# Patient Record
Sex: Female | Born: 1935 | Race: White | Hispanic: No | Smoking: Never smoker
Health system: Southern US, Community
[De-identification: ages and names within clinical notes are randomized; demographics above are authoritative.]

## PROBLEM LIST (undated history)

## (undated) DIAGNOSIS — Z8719 Personal history of other diseases of the digestive system: Secondary | ICD-10-CM

## (undated) DIAGNOSIS — I679 Cerebrovascular disease, unspecified: Secondary | ICD-10-CM

## (undated) DIAGNOSIS — S32009A Unspecified fracture of unspecified lumbar vertebra, initial encounter for closed fracture: Secondary | ICD-10-CM

## (undated) DIAGNOSIS — R270 Ataxia, unspecified: Secondary | ICD-10-CM

## (undated) DIAGNOSIS — M479 Spondylosis, unspecified: Secondary | ICD-10-CM

## (undated) DIAGNOSIS — K219 Gastro-esophageal reflux disease without esophagitis: Secondary | ICD-10-CM

## (undated) DIAGNOSIS — K5732 Diverticulitis of large intestine without perforation or abscess without bleeding: Secondary | ICD-10-CM

## (undated) DIAGNOSIS — K279 Peptic ulcer, site unspecified, unspecified as acute or chronic, without hemorrhage or perforation: Secondary | ICD-10-CM

## (undated) DIAGNOSIS — K635 Polyp of colon: Secondary | ICD-10-CM

## (undated) DIAGNOSIS — I6529 Occlusion and stenosis of unspecified carotid artery: Secondary | ICD-10-CM

## (undated) DIAGNOSIS — R5383 Other fatigue: Secondary | ICD-10-CM

## (undated) DIAGNOSIS — I10 Essential (primary) hypertension: Secondary | ICD-10-CM

## (undated) DIAGNOSIS — J45909 Unspecified asthma, uncomplicated: Secondary | ICD-10-CM

## (undated) DIAGNOSIS — E785 Hyperlipidemia, unspecified: Secondary | ICD-10-CM

## (undated) DIAGNOSIS — J310 Chronic rhinitis: Secondary | ICD-10-CM

## (undated) DIAGNOSIS — I2510 Atherosclerotic heart disease of native coronary artery without angina pectoris: Secondary | ICD-10-CM

## (undated) DIAGNOSIS — K449 Diaphragmatic hernia without obstruction or gangrene: Secondary | ICD-10-CM

## (undated) DIAGNOSIS — Z853 Personal history of malignant neoplasm of breast: Secondary | ICD-10-CM

## (undated) DIAGNOSIS — G319 Degenerative disease of nervous system, unspecified: Secondary | ICD-10-CM

## (undated) DIAGNOSIS — R002 Palpitations: Secondary | ICD-10-CM

## (undated) DIAGNOSIS — K5792 Diverticulitis of intestine, part unspecified, without perforation or abscess without bleeding: Secondary | ICD-10-CM

## (undated) DIAGNOSIS — F341 Dysthymic disorder: Secondary | ICD-10-CM

## (undated) DIAGNOSIS — R5381 Other malaise: Secondary | ICD-10-CM

## (undated) DIAGNOSIS — Z5189 Encounter for other specified aftercare: Secondary | ICD-10-CM

## (undated) DIAGNOSIS — K222 Esophageal obstruction: Secondary | ICD-10-CM

## (undated) HISTORY — DX: Palpitations: R00.2

## (undated) HISTORY — DX: Personal history of other diseases of the digestive system: Z87.19

## (undated) HISTORY — DX: Essential (primary) hypertension: I10

## (undated) HISTORY — DX: Diverticulitis of large intestine without perforation or abscess without bleeding: K57.32

## (undated) HISTORY — DX: Atherosclerotic heart disease of native coronary artery without angina pectoris: I25.10

## (undated) HISTORY — PX: OOPHORECTOMY: SHX86

## (undated) HISTORY — DX: Ataxia, unspecified: R27.0

## (undated) HISTORY — PX: VARICOSE VEIN SURGERY: SHX832

## (undated) HISTORY — DX: Diaphragmatic hernia without obstruction or gangrene: K44.9

## (undated) HISTORY — DX: Spondylosis, unspecified: M47.9

## (undated) HISTORY — DX: Esophageal obstruction: K22.2

## (undated) HISTORY — DX: Hyperlipidemia, unspecified: E78.5

## (undated) HISTORY — DX: Polyp of colon: K63.5

## (undated) HISTORY — DX: Degenerative disease of nervous system, unspecified: G31.9

## (undated) HISTORY — DX: Unspecified asthma, uncomplicated: J45.909

## (undated) HISTORY — DX: Dysthymic disorder: F34.1

## (undated) HISTORY — DX: Cerebrovascular disease, unspecified: I67.9

## (undated) HISTORY — DX: Gastro-esophageal reflux disease without esophagitis: K21.9

## (undated) HISTORY — DX: Other fatigue: R53.83

## (undated) HISTORY — DX: Other malaise: R53.81

## (undated) HISTORY — DX: Peptic ulcer, site unspecified, unspecified as acute or chronic, without hemorrhage or perforation: K27.9

## (undated) HISTORY — DX: Chronic rhinitis: J31.0

## (undated) HISTORY — DX: Unspecified fracture of unspecified lumbar vertebra, initial encounter for closed fracture: S32.009A

## (undated) HISTORY — DX: Occlusion and stenosis of unspecified carotid artery: I65.29

## (undated) HISTORY — DX: Encounter for other specified aftercare: Z51.89

## (undated) HISTORY — PX: CHOLECYSTECTOMY: SHX55

## (undated) HISTORY — DX: Personal history of malignant neoplasm of breast: Z85.3

## (undated) HISTORY — DX: Diverticulitis of intestine, part unspecified, without perforation or abscess without bleeding: K57.92

---

## 1977-08-22 HISTORY — PX: ABDOMINAL HYSTERECTOMY: SHX81

## 1997-08-22 HISTORY — PX: RECONSTRUCTION BREAST W/ TRAM FLAP: SUR1079

## 1997-08-22 HISTORY — PX: MASTECTOMY: SHX3

## 1997-12-25 ENCOUNTER — Ambulatory Visit (HOSPITAL_COMMUNITY): Admission: RE | Admit: 1997-12-25 | Discharge: 1997-12-25 | Payer: Self-pay | Admitting: Internal Medicine

## 1998-01-02 ENCOUNTER — Ambulatory Visit (HOSPITAL_COMMUNITY): Admission: RE | Admit: 1998-01-02 | Discharge: 1998-01-02 | Payer: Self-pay | Admitting: Surgery

## 1998-01-30 ENCOUNTER — Inpatient Hospital Stay (HOSPITAL_COMMUNITY): Admission: RE | Admit: 1998-01-30 | Discharge: 1998-02-04 | Payer: Self-pay | Admitting: Surgery

## 1998-12-28 ENCOUNTER — Ambulatory Visit (HOSPITAL_COMMUNITY): Admission: RE | Admit: 1998-12-28 | Discharge: 1998-12-28 | Payer: Self-pay | Admitting: Internal Medicine

## 1999-02-12 ENCOUNTER — Inpatient Hospital Stay (HOSPITAL_COMMUNITY): Admission: AD | Admit: 1999-02-12 | Discharge: 1999-02-16 | Payer: Self-pay | Admitting: Surgery

## 1999-02-12 ENCOUNTER — Encounter: Payer: Self-pay | Admitting: Surgery

## 1999-02-14 ENCOUNTER — Encounter: Payer: Self-pay | Admitting: General Surgery

## 1999-06-15 ENCOUNTER — Encounter: Admission: RE | Admit: 1999-06-15 | Discharge: 1999-06-15 | Payer: Self-pay | Admitting: Hematology and Oncology

## 1999-06-15 ENCOUNTER — Encounter: Payer: Self-pay | Admitting: Hematology and Oncology

## 2000-01-31 ENCOUNTER — Encounter: Payer: Self-pay | Admitting: Hematology and Oncology

## 2000-01-31 ENCOUNTER — Encounter: Admission: RE | Admit: 2000-01-31 | Discharge: 2000-01-31 | Payer: Self-pay | Admitting: Hematology and Oncology

## 2000-03-10 ENCOUNTER — Inpatient Hospital Stay (HOSPITAL_COMMUNITY): Admission: RE | Admit: 2000-03-10 | Discharge: 2000-03-12 | Payer: Self-pay | Admitting: Orthopedic Surgery

## 2000-08-22 HISTORY — PX: HERNIA REPAIR: SHX51

## 2000-08-22 HISTORY — PX: FINGER ARTHROPLASTY: SHX5017

## 2000-09-11 ENCOUNTER — Encounter: Payer: Self-pay | Admitting: Orthopedic Surgery

## 2000-09-15 ENCOUNTER — Inpatient Hospital Stay (HOSPITAL_COMMUNITY): Admission: RE | Admit: 2000-09-15 | Discharge: 2000-09-17 | Payer: Self-pay | Admitting: Orthopedic Surgery

## 2000-09-15 ENCOUNTER — Encounter (INDEPENDENT_AMBULATORY_CARE_PROVIDER_SITE_OTHER): Payer: Self-pay | Admitting: Specialist

## 2000-11-27 ENCOUNTER — Encounter: Payer: Self-pay | Admitting: Internal Medicine

## 2000-11-27 ENCOUNTER — Emergency Department (HOSPITAL_COMMUNITY): Admission: EM | Admit: 2000-11-27 | Discharge: 2000-11-27 | Payer: Self-pay | Admitting: Emergency Medicine

## 2001-01-24 ENCOUNTER — Encounter: Payer: Self-pay | Admitting: Hematology and Oncology

## 2001-01-24 ENCOUNTER — Encounter: Admission: RE | Admit: 2001-01-24 | Discharge: 2001-01-24 | Payer: Self-pay | Admitting: Hematology and Oncology

## 2001-01-31 ENCOUNTER — Encounter: Admission: RE | Admit: 2001-01-31 | Discharge: 2001-01-31 | Payer: Self-pay | Admitting: Hematology and Oncology

## 2001-01-31 ENCOUNTER — Encounter: Payer: Self-pay | Admitting: Hematology and Oncology

## 2001-03-30 ENCOUNTER — Observation Stay (HOSPITAL_COMMUNITY): Admission: RE | Admit: 2001-03-30 | Discharge: 2001-03-31 | Payer: Self-pay | Admitting: Surgery

## 2002-02-06 ENCOUNTER — Encounter: Admission: RE | Admit: 2002-02-06 | Discharge: 2002-02-06 | Payer: Self-pay | Admitting: Hematology and Oncology

## 2002-02-06 ENCOUNTER — Encounter: Payer: Self-pay | Admitting: Hematology and Oncology

## 2002-09-11 ENCOUNTER — Encounter: Payer: Self-pay | Admitting: Orthopedic Surgery

## 2002-09-17 ENCOUNTER — Ambulatory Visit (HOSPITAL_COMMUNITY): Admission: RE | Admit: 2002-09-17 | Discharge: 2002-09-17 | Payer: Self-pay | Admitting: Orthopedic Surgery

## 2002-09-26 ENCOUNTER — Encounter: Payer: Self-pay | Admitting: Orthopedic Surgery

## 2002-09-26 ENCOUNTER — Inpatient Hospital Stay (HOSPITAL_COMMUNITY): Admission: RE | Admit: 2002-09-26 | Discharge: 2002-09-28 | Payer: Self-pay | Admitting: Orthopedic Surgery

## 2003-02-10 ENCOUNTER — Encounter: Payer: Self-pay | Admitting: Oncology

## 2003-02-10 ENCOUNTER — Encounter: Admission: RE | Admit: 2003-02-10 | Discharge: 2003-02-10 | Payer: Self-pay | Admitting: Oncology

## 2003-08-23 DIAGNOSIS — I2510 Atherosclerotic heart disease of native coronary artery without angina pectoris: Secondary | ICD-10-CM

## 2003-08-23 HISTORY — DX: Atherosclerotic heart disease of native coronary artery without angina pectoris: I25.10

## 2004-02-09 ENCOUNTER — Ambulatory Visit (HOSPITAL_COMMUNITY): Admission: RE | Admit: 2004-02-09 | Discharge: 2004-02-09 | Payer: Self-pay | Admitting: Internal Medicine

## 2004-02-11 ENCOUNTER — Encounter: Admission: RE | Admit: 2004-02-11 | Discharge: 2004-02-11 | Payer: Self-pay | Admitting: Oncology

## 2004-04-12 ENCOUNTER — Inpatient Hospital Stay (HOSPITAL_COMMUNITY): Admission: AD | Admit: 2004-04-12 | Discharge: 2004-04-15 | Payer: Self-pay | Admitting: Internal Medicine

## 2004-04-27 ENCOUNTER — Ambulatory Visit (HOSPITAL_COMMUNITY): Admission: RE | Admit: 2004-04-27 | Discharge: 2004-04-27 | Payer: Self-pay | Admitting: Internal Medicine

## 2004-05-06 ENCOUNTER — Encounter (INDEPENDENT_AMBULATORY_CARE_PROVIDER_SITE_OTHER): Payer: Self-pay | Admitting: *Deleted

## 2004-05-06 ENCOUNTER — Ambulatory Visit: Admission: RE | Admit: 2004-05-06 | Discharge: 2004-05-06 | Payer: Self-pay | Admitting: Critical Care Medicine

## 2004-05-12 ENCOUNTER — Ambulatory Visit (HOSPITAL_COMMUNITY): Admission: RE | Admit: 2004-05-12 | Discharge: 2004-05-12 | Payer: Self-pay | Admitting: Critical Care Medicine

## 2004-06-23 ENCOUNTER — Ambulatory Visit: Payer: Self-pay | Admitting: *Deleted

## 2004-06-28 ENCOUNTER — Ambulatory Visit: Payer: Self-pay | Admitting: Internal Medicine

## 2004-06-30 ENCOUNTER — Encounter: Admission: RE | Admit: 2004-06-30 | Discharge: 2004-06-30 | Payer: Self-pay | Admitting: Internal Medicine

## 2004-07-01 ENCOUNTER — Ambulatory Visit: Payer: Self-pay | Admitting: Critical Care Medicine

## 2004-07-23 ENCOUNTER — Encounter: Admission: RE | Admit: 2004-07-23 | Discharge: 2004-07-23 | Payer: Self-pay | Admitting: Internal Medicine

## 2004-08-11 ENCOUNTER — Encounter: Admission: RE | Admit: 2004-08-11 | Discharge: 2004-08-11 | Payer: Self-pay | Admitting: Orthopedic Surgery

## 2004-08-31 ENCOUNTER — Encounter: Admission: RE | Admit: 2004-08-31 | Discharge: 2004-08-31 | Payer: Self-pay | Admitting: Internal Medicine

## 2004-10-18 ENCOUNTER — Ambulatory Visit: Payer: Self-pay | Admitting: Critical Care Medicine

## 2004-10-18 ENCOUNTER — Ambulatory Visit (HOSPITAL_COMMUNITY): Admission: RE | Admit: 2004-10-18 | Discharge: 2004-10-18 | Payer: Self-pay | Admitting: Internal Medicine

## 2004-10-18 ENCOUNTER — Ambulatory Visit: Payer: Self-pay | Admitting: Internal Medicine

## 2004-10-21 ENCOUNTER — Encounter: Admission: RE | Admit: 2004-10-21 | Discharge: 2004-10-21 | Payer: Self-pay | Admitting: Interventional Radiology

## 2004-10-26 ENCOUNTER — Ambulatory Visit (HOSPITAL_COMMUNITY): Admission: RE | Admit: 2004-10-26 | Discharge: 2004-10-26 | Payer: Self-pay | Admitting: Interventional Radiology

## 2004-10-26 ENCOUNTER — Encounter (INDEPENDENT_AMBULATORY_CARE_PROVIDER_SITE_OTHER): Payer: Self-pay | Admitting: Specialist

## 2004-12-07 ENCOUNTER — Ambulatory Visit (HOSPITAL_COMMUNITY): Admission: RE | Admit: 2004-12-07 | Discharge: 2004-12-07 | Payer: Self-pay | Admitting: Interventional Radiology

## 2004-12-10 ENCOUNTER — Encounter: Admission: RE | Admit: 2004-12-10 | Discharge: 2004-12-10 | Payer: Self-pay | Admitting: Interventional Radiology

## 2004-12-15 ENCOUNTER — Ambulatory Visit: Payer: Self-pay | Admitting: Internal Medicine

## 2004-12-27 ENCOUNTER — Ambulatory Visit (HOSPITAL_COMMUNITY): Admission: RE | Admit: 2004-12-27 | Discharge: 2004-12-27 | Payer: Self-pay | Admitting: Internal Medicine

## 2005-01-03 ENCOUNTER — Ambulatory Visit: Payer: Self-pay | Admitting: Oncology

## 2005-01-10 ENCOUNTER — Encounter: Payer: Self-pay | Admitting: Internal Medicine

## 2005-01-21 ENCOUNTER — Ambulatory Visit: Payer: Self-pay | Admitting: *Deleted

## 2005-01-31 ENCOUNTER — Ambulatory Visit: Payer: Self-pay

## 2005-02-02 ENCOUNTER — Ambulatory Visit: Payer: Self-pay | Admitting: *Deleted

## 2005-03-10 ENCOUNTER — Ambulatory Visit: Payer: Self-pay | Admitting: Internal Medicine

## 2005-03-16 ENCOUNTER — Encounter: Admission: RE | Admit: 2005-03-16 | Discharge: 2005-03-16 | Payer: Self-pay | Admitting: Oncology

## 2005-03-17 ENCOUNTER — Ambulatory Visit: Payer: Self-pay | Admitting: Oncology

## 2005-03-17 ENCOUNTER — Ambulatory Visit: Payer: Self-pay | Admitting: Internal Medicine

## 2005-04-08 ENCOUNTER — Ambulatory Visit: Payer: Self-pay | Admitting: Internal Medicine

## 2005-04-27 ENCOUNTER — Ambulatory Visit: Payer: Self-pay | Admitting: Cardiology

## 2005-05-05 ENCOUNTER — Ambulatory Visit: Payer: Self-pay

## 2005-06-01 ENCOUNTER — Ambulatory Visit: Payer: Self-pay | Admitting: Cardiology

## 2005-06-14 ENCOUNTER — Ambulatory Visit: Payer: Self-pay | Admitting: Cardiology

## 2005-06-14 ENCOUNTER — Ambulatory Visit: Payer: Self-pay

## 2005-06-27 ENCOUNTER — Ambulatory Visit: Payer: Self-pay | Admitting: Internal Medicine

## 2005-08-02 ENCOUNTER — Ambulatory Visit: Payer: Self-pay | Admitting: Cardiology

## 2005-08-24 ENCOUNTER — Ambulatory Visit: Payer: Self-pay | Admitting: Cardiology

## 2005-08-30 ENCOUNTER — Ambulatory Visit: Payer: Self-pay | Admitting: Internal Medicine

## 2005-10-17 ENCOUNTER — Ambulatory Visit: Payer: Self-pay | Admitting: Cardiology

## 2005-10-18 ENCOUNTER — Ambulatory Visit: Payer: Self-pay

## 2005-11-21 ENCOUNTER — Ambulatory Visit: Payer: Self-pay | Admitting: Internal Medicine

## 2005-11-21 ENCOUNTER — Ambulatory Visit: Payer: Self-pay | Admitting: Cardiology

## 2005-11-23 ENCOUNTER — Ambulatory Visit: Payer: Self-pay | Admitting: Internal Medicine

## 2005-11-29 ENCOUNTER — Encounter: Admission: RE | Admit: 2005-11-29 | Discharge: 2005-11-29 | Payer: Self-pay | Admitting: Internal Medicine

## 2005-11-29 ENCOUNTER — Ambulatory Visit: Payer: Self-pay | Admitting: Cardiology

## 2005-12-21 ENCOUNTER — Encounter: Payer: Self-pay | Admitting: Internal Medicine

## 2005-12-21 ENCOUNTER — Encounter: Payer: Self-pay | Admitting: Orthopedic Surgery

## 2006-01-10 ENCOUNTER — Ambulatory Visit: Payer: Self-pay | Admitting: Cardiology

## 2006-03-13 ENCOUNTER — Ambulatory Visit: Payer: Self-pay | Admitting: Cardiology

## 2006-03-28 ENCOUNTER — Encounter: Admission: RE | Admit: 2006-03-28 | Discharge: 2006-03-28 | Payer: Self-pay | Admitting: Internal Medicine

## 2006-06-08 ENCOUNTER — Ambulatory Visit: Payer: Self-pay | Admitting: Cardiology

## 2006-07-24 ENCOUNTER — Ambulatory Visit: Payer: Self-pay | Admitting: Internal Medicine

## 2006-07-26 ENCOUNTER — Ambulatory Visit: Payer: Self-pay | Admitting: Cardiology

## 2006-08-08 ENCOUNTER — Ambulatory Visit: Payer: Self-pay

## 2006-10-11 ENCOUNTER — Ambulatory Visit: Payer: Self-pay | Admitting: Internal Medicine

## 2006-12-07 ENCOUNTER — Ambulatory Visit: Payer: Self-pay | Admitting: Cardiology

## 2007-01-18 ENCOUNTER — Ambulatory Visit: Payer: Self-pay | Admitting: Internal Medicine

## 2007-01-18 LAB — CONVERTED CEMR LAB
Basophils Absolute: 0 10*3/uL (ref 0.0–0.1)
Basophils Relative: 0.1 % (ref 0.0–1.0)
Eosinophils Relative: 1.7 % (ref 0.0–5.0)
HCT: 39.5 % (ref 36.0–46.0)
Lymphocytes Relative: 18.8 % (ref 12.0–46.0)
MCHC: 34 g/dL (ref 30.0–36.0)
Neutrophils Relative %: 67.1 % (ref 43.0–77.0)
RBC: 4.31 M/uL (ref 3.87–5.11)
RDW: 13.2 % (ref 11.5–14.6)
Sed Rate: 40 mm/hr — ABNORMAL HIGH (ref 0–25)
Total CK: 301 units/L (ref 7–177)

## 2007-01-30 ENCOUNTER — Ambulatory Visit: Payer: Self-pay | Admitting: Internal Medicine

## 2007-02-21 ENCOUNTER — Ambulatory Visit: Payer: Self-pay | Admitting: Internal Medicine

## 2007-02-21 LAB — CONVERTED CEMR LAB
Cholesterol: 336 mg/dL (ref 0–200)
Direct LDL: 244.9 mg/dL
HDL: 55.4 mg/dL (ref >39.0)
Total CHOL/HDL Ratio: 6.1
Triglycerides: 146 mg/dL (ref 0–149)

## 2007-03-16 DIAGNOSIS — M479 Spondylosis, unspecified: Secondary | ICD-10-CM

## 2007-03-16 DIAGNOSIS — Z853 Personal history of malignant neoplasm of breast: Secondary | ICD-10-CM

## 2007-03-16 DIAGNOSIS — K279 Peptic ulcer, site unspecified, unspecified as acute or chronic, without hemorrhage or perforation: Secondary | ICD-10-CM | POA: Insufficient documentation

## 2007-03-16 DIAGNOSIS — K5732 Diverticulitis of large intestine without perforation or abscess without bleeding: Secondary | ICD-10-CM

## 2007-03-16 DIAGNOSIS — Z8719 Personal history of other diseases of the digestive system: Secondary | ICD-10-CM

## 2007-03-16 HISTORY — DX: Spondylosis, unspecified: M47.9

## 2007-03-16 HISTORY — DX: Diverticulitis of large intestine without perforation or abscess without bleeding: K57.32

## 2007-03-16 HISTORY — DX: Peptic ulcer, site unspecified, unspecified as acute or chronic, without hemorrhage or perforation: K27.9

## 2007-03-16 HISTORY — DX: Personal history of other diseases of the digestive system: Z87.19

## 2007-03-16 HISTORY — DX: Personal history of malignant neoplasm of breast: Z85.3

## 2007-03-23 ENCOUNTER — Ambulatory Visit: Payer: Self-pay | Admitting: Internal Medicine

## 2007-04-03 ENCOUNTER — Encounter: Admission: RE | Admit: 2007-04-03 | Discharge: 2007-04-03 | Payer: Self-pay | Admitting: Internal Medicine

## 2007-05-10 ENCOUNTER — Ambulatory Visit: Payer: Self-pay | Admitting: Internal Medicine

## 2007-05-10 LAB — CONVERTED CEMR LAB
Direct LDL: 176.5 mg/dL
HDL: 45.7 mg/dL (ref >39.0)
VLDL: 23 mg/dL (ref 0–40)

## 2007-05-16 ENCOUNTER — Ambulatory Visit: Payer: Self-pay | Admitting: Internal Medicine

## 2007-05-17 ENCOUNTER — Ambulatory Visit: Payer: Self-pay | Admitting: Internal Medicine

## 2007-05-17 ENCOUNTER — Encounter: Payer: Self-pay | Admitting: Internal Medicine

## 2007-05-18 DIAGNOSIS — E785 Hyperlipidemia, unspecified: Secondary | ICD-10-CM

## 2007-05-18 DIAGNOSIS — IMO0001 Reserved for inherently not codable concepts without codable children: Secondary | ICD-10-CM

## 2007-05-18 HISTORY — DX: Hyperlipidemia, unspecified: E78.5

## 2007-05-28 ENCOUNTER — Ambulatory Visit: Payer: Self-pay | Admitting: Internal Medicine

## 2007-05-28 LAB — CONVERTED CEMR LAB: Total CK: 166 units/L (ref 7–177)

## 2007-06-05 ENCOUNTER — Ambulatory Visit: Payer: Self-pay | Admitting: Cardiology

## 2007-06-13 ENCOUNTER — Encounter: Admission: RE | Admit: 2007-06-13 | Discharge: 2007-06-13 | Payer: Self-pay | Admitting: Rheumatology

## 2007-06-13 DIAGNOSIS — S32009A Unspecified fracture of unspecified lumbar vertebra, initial encounter for closed fracture: Secondary | ICD-10-CM

## 2007-06-13 HISTORY — DX: Unspecified fracture of unspecified lumbar vertebra, initial encounter for closed fracture: S32.009A

## 2007-06-14 ENCOUNTER — Ambulatory Visit: Payer: Self-pay

## 2007-06-14 ENCOUNTER — Telehealth: Payer: Self-pay | Admitting: Internal Medicine

## 2007-06-14 ENCOUNTER — Ambulatory Visit: Payer: Self-pay | Admitting: Cardiology

## 2007-06-14 ENCOUNTER — Inpatient Hospital Stay (HOSPITAL_COMMUNITY): Admission: AD | Admit: 2007-06-14 | Discharge: 2007-06-15 | Payer: Self-pay | Admitting: Cardiology

## 2007-06-21 ENCOUNTER — Inpatient Hospital Stay (HOSPITAL_BASED_OUTPATIENT_CLINIC_OR_DEPARTMENT_OTHER): Admission: RE | Admit: 2007-06-21 | Discharge: 2007-06-21 | Payer: Self-pay | Admitting: Cardiology

## 2007-06-21 ENCOUNTER — Observation Stay (HOSPITAL_COMMUNITY): Admission: AD | Admit: 2007-06-21 | Discharge: 2007-06-22 | Payer: Self-pay | Admitting: Cardiology

## 2007-06-21 ENCOUNTER — Ambulatory Visit: Payer: Self-pay | Admitting: Cardiology

## 2007-07-03 ENCOUNTER — Ambulatory Visit: Payer: Self-pay | Admitting: Cardiology

## 2007-07-03 ENCOUNTER — Encounter: Payer: Self-pay | Admitting: Internal Medicine

## 2007-08-01 ENCOUNTER — Ambulatory Visit: Payer: Self-pay | Admitting: Internal Medicine

## 2007-08-01 ENCOUNTER — Encounter (INDEPENDENT_AMBULATORY_CARE_PROVIDER_SITE_OTHER): Payer: Self-pay | Admitting: *Deleted

## 2007-08-01 DIAGNOSIS — R0989 Other specified symptoms and signs involving the circulatory and respiratory systems: Secondary | ICD-10-CM

## 2007-08-01 DIAGNOSIS — R0609 Other forms of dyspnea: Secondary | ICD-10-CM

## 2007-08-01 DIAGNOSIS — I10 Essential (primary) hypertension: Secondary | ICD-10-CM | POA: Insufficient documentation

## 2007-08-07 ENCOUNTER — Encounter: Payer: Self-pay | Admitting: Internal Medicine

## 2007-08-28 ENCOUNTER — Encounter: Payer: Self-pay | Admitting: Internal Medicine

## 2007-08-28 ENCOUNTER — Ambulatory Visit: Payer: Self-pay | Admitting: Internal Medicine

## 2007-09-06 ENCOUNTER — Ambulatory Visit: Payer: Self-pay | Admitting: Internal Medicine

## 2007-09-06 DIAGNOSIS — R5381 Other malaise: Secondary | ICD-10-CM

## 2007-09-06 DIAGNOSIS — R5383 Other fatigue: Secondary | ICD-10-CM

## 2007-09-06 HISTORY — DX: Other malaise: R53.81

## 2007-09-06 LAB — CONVERTED CEMR LAB
AST: 20 units/L (ref 0–37)
HDL: 47.5 mg/dL (ref >39.0)
Total Protein: 7.2 g/dL (ref 6.0–8.3)

## 2007-09-07 ENCOUNTER — Encounter: Payer: Self-pay | Admitting: Internal Medicine

## 2007-10-10 ENCOUNTER — Ambulatory Visit: Payer: Self-pay | Admitting: Critical Care Medicine

## 2007-10-10 DIAGNOSIS — K219 Gastro-esophageal reflux disease without esophagitis: Secondary | ICD-10-CM | POA: Insufficient documentation

## 2007-10-10 HISTORY — DX: Gastro-esophageal reflux disease without esophagitis: K21.9

## 2007-11-13 ENCOUNTER — Ambulatory Visit: Payer: Self-pay | Admitting: Critical Care Medicine

## 2007-11-19 ENCOUNTER — Ambulatory Visit: Payer: Self-pay | Admitting: Cardiology

## 2007-11-19 LAB — CONVERTED CEMR LAB
Alkaline Phosphatase: 53 units/L (ref 39–117)
Bilirubin, Direct: 0.1 mg/dL (ref 0.0–0.3)
Cholesterol: 214 mg/dL (ref 0–200)
Direct LDL: 154.7 mg/dL
Total Bilirubin: 0.7 mg/dL (ref 0.3–1.2)
Total CHOL/HDL Ratio: 4.6
Total CK: 182 units/L — ABNORMAL HIGH (ref 7–177)
Triglycerides: 88 mg/dL (ref 0–149)

## 2007-11-27 ENCOUNTER — Telehealth: Payer: Self-pay | Admitting: Internal Medicine

## 2007-11-28 ENCOUNTER — Telehealth: Payer: Self-pay | Admitting: Internal Medicine

## 2007-12-25 ENCOUNTER — Ambulatory Visit: Payer: Self-pay | Admitting: Critical Care Medicine

## 2007-12-25 ENCOUNTER — Ambulatory Visit: Payer: Self-pay | Admitting: Cardiology

## 2007-12-25 LAB — CONVERTED CEMR LAB
AST: 22 units/L (ref 0–37)
Alkaline Phosphatase: 48 units/L (ref 39–117)
HDL: 41.4 mg/dL (ref >39.0)
LDL Cholesterol: 124 mg/dL — ABNORMAL HIGH (ref 0–99)
Total Bilirubin: 0.8 mg/dL (ref 0.3–1.2)
Total CHOL/HDL Ratio: 4.6
Total Protein: 7.3 g/dL (ref 6.0–8.3)

## 2008-01-24 ENCOUNTER — Ambulatory Visit: Payer: Self-pay | Admitting: Internal Medicine

## 2008-01-24 DIAGNOSIS — N3000 Acute cystitis without hematuria: Secondary | ICD-10-CM

## 2008-01-24 LAB — CONVERTED CEMR LAB
Bacteria, UA: NEGATIVE
Crystals: NEGATIVE
Leukocytes, UA: NEGATIVE
RBC / HPF: NONE SEEN
Specific Gravity, Urine: >=1.03 (ref 1.000–1.03)
Total Protein, Urine: NEGATIVE mg/dL
Urine Glucose: NEGATIVE mg/dL
Urobilinogen, UA: 0.2 (ref 0.0–1.0)

## 2008-01-28 ENCOUNTER — Telehealth: Payer: Self-pay | Admitting: Internal Medicine

## 2008-01-30 ENCOUNTER — Ambulatory Visit: Payer: Self-pay | Admitting: Cardiology

## 2008-02-08 ENCOUNTER — Ambulatory Visit: Payer: Self-pay | Admitting: Internal Medicine

## 2008-02-08 LAB — CONVERTED CEMR LAB
Basophils Absolute: 0 10*3/uL (ref 0.0–0.1)
Basophils Relative: 0.6 % (ref 0.0–1.0)
Bilirubin Urine: NEGATIVE
Eosinophils Relative: 2.2 % (ref 0.0–5.0)
Hemoglobin, Urine: NEGATIVE
Lymphocytes Relative: 40 % (ref 12.0–46.0)
MCHC: 33.9 g/dL (ref 30.0–36.0)
MCV: 93.1 fL (ref 78.0–100.0)
Monocytes Relative: 11.2 % (ref 3.0–12.0)
Neutrophils Relative %: 46 % (ref 43.0–77.0)
Nitrite: NEGATIVE
Platelets: 285 10*3/uL (ref 150–400)
RBC: 4.05 M/uL (ref 3.87–5.11)
Urine Glucose: NEGATIVE mg/dL

## 2008-02-18 ENCOUNTER — Ambulatory Visit: Payer: Self-pay | Admitting: Internal Medicine

## 2008-04-08 ENCOUNTER — Encounter: Admission: RE | Admit: 2008-04-08 | Discharge: 2008-04-08 | Payer: Self-pay | Admitting: Internal Medicine

## 2008-04-16 ENCOUNTER — Ambulatory Visit: Payer: Self-pay | Admitting: Cardiovascular Disease

## 2008-04-16 LAB — CONVERTED CEMR LAB
ALT: 19 units/L (ref 0–35)
Bilirubin, Direct: 0.1 mg/dL (ref 0.0–0.3)
Cholesterol: 265 mg/dL (ref 0–200)
Triglycerides: 218 mg/dL (ref 0–149)
VLDL: 44 mg/dL — ABNORMAL HIGH (ref 0–40)

## 2008-05-08 ENCOUNTER — Ambulatory Visit: Payer: Self-pay | Admitting: Critical Care Medicine

## 2008-05-24 ENCOUNTER — Ambulatory Visit: Payer: Self-pay | Admitting: Family Medicine

## 2008-06-27 ENCOUNTER — Ambulatory Visit: Payer: Self-pay | Admitting: Cardiology

## 2008-08-12 ENCOUNTER — Telehealth: Payer: Self-pay | Admitting: Internal Medicine

## 2008-09-17 ENCOUNTER — Ambulatory Visit: Payer: Self-pay | Admitting: Critical Care Medicine

## 2008-09-17 DIAGNOSIS — J310 Chronic rhinitis: Secondary | ICD-10-CM

## 2008-09-17 HISTORY — DX: Chronic rhinitis: J31.0

## 2008-10-28 ENCOUNTER — Encounter: Payer: Self-pay | Admitting: Cardiology

## 2008-10-28 ENCOUNTER — Ambulatory Visit: Payer: Self-pay | Admitting: Cardiology

## 2008-10-28 DIAGNOSIS — R072 Precordial pain: Secondary | ICD-10-CM | POA: Insufficient documentation

## 2008-10-28 DIAGNOSIS — I6529 Occlusion and stenosis of unspecified carotid artery: Secondary | ICD-10-CM | POA: Insufficient documentation

## 2008-10-28 DIAGNOSIS — I2510 Atherosclerotic heart disease of native coronary artery without angina pectoris: Secondary | ICD-10-CM

## 2008-10-28 DIAGNOSIS — R002 Palpitations: Secondary | ICD-10-CM | POA: Insufficient documentation

## 2008-10-28 DIAGNOSIS — I10 Essential (primary) hypertension: Secondary | ICD-10-CM

## 2008-10-28 HISTORY — DX: Essential (primary) hypertension: I10

## 2008-10-28 HISTORY — DX: Atherosclerotic heart disease of native coronary artery without angina pectoris: I25.10

## 2008-10-28 HISTORY — DX: Occlusion and stenosis of unspecified carotid artery: I65.29

## 2008-10-30 ENCOUNTER — Ambulatory Visit: Payer: Self-pay | Admitting: Cardiology

## 2008-10-30 LAB — CONVERTED CEMR LAB
ALT: 20 units/L (ref 0–35)
Alkaline Phosphatase: 47 units/L (ref 39–117)
Bilirubin, Direct: 0.1 mg/dL (ref 0.0–0.3)
Cholesterol: 216 mg/dL (ref 0–200)
Direct LDL: 148.7 mg/dL
Glucose, Bld: 91 mg/dL (ref 70–99)
HDL: 47.8 mg/dL (ref >39.0)
Total Bilirubin: 0.9 mg/dL (ref 0.3–1.2)
Total Protein: 6.9 g/dL (ref 6.0–8.3)

## 2009-03-20 ENCOUNTER — Telehealth: Payer: Self-pay | Admitting: Internal Medicine

## 2009-03-28 ENCOUNTER — Ambulatory Visit: Payer: Self-pay | Admitting: Family Medicine

## 2009-03-28 ENCOUNTER — Telehealth: Payer: Self-pay | Admitting: Family Medicine

## 2009-03-30 ENCOUNTER — Ambulatory Visit: Payer: Self-pay | Admitting: Internal Medicine

## 2009-03-30 ENCOUNTER — Inpatient Hospital Stay (HOSPITAL_COMMUNITY): Admission: AD | Admit: 2009-03-30 | Discharge: 2009-04-02 | Payer: Self-pay | Admitting: Internal Medicine

## 2009-03-30 ENCOUNTER — Telehealth: Payer: Self-pay | Admitting: Internal Medicine

## 2009-03-31 ENCOUNTER — Encounter: Payer: Self-pay | Admitting: Cardiology

## 2009-03-31 ENCOUNTER — Ambulatory Visit: Payer: Self-pay | Admitting: Gastroenterology

## 2009-04-01 ENCOUNTER — Encounter: Payer: Self-pay | Admitting: Gastroenterology

## 2009-04-10 ENCOUNTER — Telehealth: Payer: Self-pay | Admitting: Internal Medicine

## 2009-04-28 ENCOUNTER — Telehealth: Payer: Self-pay | Admitting: Internal Medicine

## 2009-05-04 ENCOUNTER — Encounter: Admission: RE | Admit: 2009-05-04 | Discharge: 2009-05-04 | Payer: Self-pay | Admitting: Internal Medicine

## 2009-05-15 ENCOUNTER — Ambulatory Visit: Payer: Self-pay | Admitting: Cardiology

## 2009-05-18 ENCOUNTER — Encounter (INDEPENDENT_AMBULATORY_CARE_PROVIDER_SITE_OTHER): Payer: Self-pay | Admitting: *Deleted

## 2009-07-11 ENCOUNTER — Ambulatory Visit: Payer: Self-pay | Admitting: Family Medicine

## 2009-07-11 DIAGNOSIS — M719 Bursopathy, unspecified: Secondary | ICD-10-CM

## 2009-07-11 DIAGNOSIS — M67919 Unspecified disorder of synovium and tendon, unspecified shoulder: Secondary | ICD-10-CM | POA: Insufficient documentation

## 2009-08-25 ENCOUNTER — Ambulatory Visit: Payer: Self-pay | Admitting: Internal Medicine

## 2009-08-25 DIAGNOSIS — F341 Dysthymic disorder: Secondary | ICD-10-CM

## 2009-08-25 HISTORY — DX: Dysthymic disorder: F34.1

## 2009-09-08 ENCOUNTER — Ambulatory Visit: Payer: Self-pay | Admitting: Family Medicine

## 2009-09-08 ENCOUNTER — Telehealth (INDEPENDENT_AMBULATORY_CARE_PROVIDER_SITE_OTHER): Payer: Self-pay | Admitting: *Deleted

## 2009-09-08 ENCOUNTER — Encounter: Payer: Self-pay | Admitting: Internal Medicine

## 2009-09-10 ENCOUNTER — Ambulatory Visit: Payer: Self-pay | Admitting: Psychology

## 2009-09-17 ENCOUNTER — Ambulatory Visit: Payer: Self-pay | Admitting: Psychology

## 2009-09-21 ENCOUNTER — Encounter: Payer: Self-pay | Admitting: Internal Medicine

## 2009-09-28 ENCOUNTER — Ambulatory Visit: Payer: Self-pay | Admitting: Psychology

## 2009-10-12 ENCOUNTER — Ambulatory Visit: Payer: Self-pay | Admitting: Psychology

## 2009-11-05 ENCOUNTER — Ambulatory Visit: Payer: Self-pay | Admitting: Cardiology

## 2009-11-09 ENCOUNTER — Telehealth (INDEPENDENT_AMBULATORY_CARE_PROVIDER_SITE_OTHER): Payer: Self-pay | Admitting: *Deleted

## 2009-11-10 ENCOUNTER — Ambulatory Visit: Payer: Self-pay

## 2009-11-10 ENCOUNTER — Ambulatory Visit: Payer: Self-pay | Admitting: Cardiology

## 2009-11-10 ENCOUNTER — Encounter (HOSPITAL_COMMUNITY): Admission: RE | Admit: 2009-11-10 | Discharge: 2010-01-20 | Payer: Self-pay | Admitting: Cardiology

## 2009-11-11 LAB — CONVERTED CEMR LAB
ALT: 20 units/L (ref 0–35)
AST: 24 units/L (ref 0–37)
Albumin: 3.7 g/dL (ref 3.5–5.2)
Alkaline Phosphatase: 43 units/L (ref 39–117)
CO2: 29 meq/L (ref 19–32)
Calcium: 9.4 mg/dL (ref 8.4–10.5)
Cholesterol: 230 mg/dL — ABNORMAL HIGH (ref 0–200)
Potassium: 4.8 meq/L (ref 3.5–5.1)
Sodium: 142 meq/L (ref 135–145)
Total Bilirubin: 0.7 mg/dL (ref 0.3–1.2)
Total CHOL/HDL Ratio: 5
Total Protein: 7.3 g/dL (ref 6.0–8.3)
Triglycerides: 87 mg/dL (ref 0.0–149.0)

## 2009-11-16 ENCOUNTER — Ambulatory Visit: Payer: Self-pay | Admitting: Internal Medicine

## 2009-11-16 LAB — CONVERTED CEMR LAB
Basophils Absolute: 0 10*3/uL (ref 0.0–0.1)
Eosinophils Absolute: 0.1 10*3/uL (ref 0.0–0.7)
Eosinophils Relative: 1.7 % (ref 0.0–5.0)
HCT: 41.2 % (ref 36.0–46.0)
Lymphocytes Relative: 44.4 % (ref 12.0–46.0)
Lymphs Abs: 2.1 10*3/uL (ref 0.7–4.0)
Monocytes Absolute: 0.6 10*3/uL (ref 0.1–1.0)
Monocytes Relative: 13.5 % — ABNORMAL HIGH (ref 3.0–12.0)
Platelets: 295 10*3/uL (ref 150.0–400.0)
RBC: 4.32 M/uL (ref 3.87–5.11)
Saturation Ratios: 36.1 % (ref 20.0–50.0)
TSH: 3.5 microintl units/mL (ref 0.35–5.50)

## 2009-12-16 ENCOUNTER — Encounter (INDEPENDENT_AMBULATORY_CARE_PROVIDER_SITE_OTHER): Payer: Self-pay | Admitting: *Deleted

## 2009-12-17 ENCOUNTER — Telehealth (INDEPENDENT_AMBULATORY_CARE_PROVIDER_SITE_OTHER): Payer: Self-pay | Admitting: *Deleted

## 2009-12-24 ENCOUNTER — Ambulatory Visit: Payer: Self-pay | Admitting: Cardiology

## 2010-01-28 ENCOUNTER — Telehealth: Payer: Self-pay | Admitting: Internal Medicine

## 2010-02-15 ENCOUNTER — Ambulatory Visit: Payer: Self-pay | Admitting: Cardiology

## 2010-02-15 LAB — CONVERTED CEMR LAB
Alkaline Phosphatase: 43 units/L (ref 39–117)
Bilirubin, Direct: 0.1 mg/dL (ref 0.0–0.3)
Direct LDL: 127 mg/dL
HDL: 60.9 mg/dL (ref >39.00)
Total Bilirubin: 0.6 mg/dL (ref 0.3–1.2)
Total CHOL/HDL Ratio: 4
Total Protein: 7 g/dL (ref 6.0–8.3)
VLDL: 27 mg/dL (ref 0.0–40.0)

## 2010-02-25 ENCOUNTER — Ambulatory Visit: Payer: Self-pay | Admitting: Internal Medicine

## 2010-06-25 ENCOUNTER — Ambulatory Visit: Payer: Self-pay | Admitting: Cardiology

## 2010-06-25 LAB — CONVERTED CEMR LAB
Albumin: 3.7 g/dL (ref 3.5–5.2)
Cholesterol: 234 mg/dL — ABNORMAL HIGH (ref 0–200)
Direct LDL: 150.6 mg/dL
HDL: 58.2 mg/dL (ref >39.00)
Total Bilirubin: 0.5 mg/dL (ref 0.3–1.2)
Total CHOL/HDL Ratio: 4
Triglycerides: 125 mg/dL (ref 0.0–149.0)
VLDL: 25 mg/dL (ref 0.0–40.0)

## 2010-06-28 ENCOUNTER — Ambulatory Visit: Payer: Self-pay | Admitting: Internal Medicine

## 2010-07-12 ENCOUNTER — Ambulatory Visit: Payer: Self-pay | Admitting: Internal Medicine

## 2010-07-12 ENCOUNTER — Telehealth: Payer: Self-pay | Admitting: Internal Medicine

## 2010-07-13 ENCOUNTER — Encounter: Admission: RE | Admit: 2010-07-13 | Discharge: 2010-07-13 | Payer: Self-pay | Admitting: Internal Medicine

## 2010-07-13 LAB — HM MAMMOGRAPHY: HM Mammogram: NEGATIVE

## 2010-07-20 ENCOUNTER — Ambulatory Visit: Payer: Self-pay | Admitting: Cardiology

## 2010-09-11 ENCOUNTER — Encounter: Payer: Self-pay | Admitting: Internal Medicine

## 2010-09-12 ENCOUNTER — Encounter: Payer: Self-pay | Admitting: Interventional Radiology

## 2010-09-18 ENCOUNTER — Ambulatory Visit
Admission: RE | Admit: 2010-09-18 | Discharge: 2010-09-18 | Payer: Self-pay | Source: Home / Self Care | Attending: Family Medicine | Admitting: Family Medicine

## 2010-09-19 ENCOUNTER — Inpatient Hospital Stay (HOSPITAL_COMMUNITY)
Admission: EM | Admit: 2010-09-19 | Discharge: 2010-09-22 | DRG: 392 | Disposition: A | Discharge status: Home / Self Care | Payer: Medicare Other | Attending: Internal Medicine | Admitting: Internal Medicine

## 2010-09-19 DIAGNOSIS — K5732 Diverticulitis of large intestine without perforation or abscess without bleeding: Principal | ICD-10-CM | POA: Diagnosis present

## 2010-09-19 DIAGNOSIS — R0789 Other chest pain: Secondary | ICD-10-CM | POA: Diagnosis present

## 2010-09-19 DIAGNOSIS — Z9861 Coronary angioplasty status: Secondary | ICD-10-CM

## 2010-09-19 DIAGNOSIS — Z853 Personal history of malignant neoplasm of breast: Secondary | ICD-10-CM

## 2010-09-19 DIAGNOSIS — F3289 Other specified depressive episodes: Secondary | ICD-10-CM | POA: Diagnosis present

## 2010-09-19 DIAGNOSIS — Z79899 Other long term (current) drug therapy: Secondary | ICD-10-CM

## 2010-09-19 DIAGNOSIS — I10 Essential (primary) hypertension: Secondary | ICD-10-CM | POA: Diagnosis present

## 2010-09-19 DIAGNOSIS — I2510 Atherosclerotic heart disease of native coronary artery without angina pectoris: Secondary | ICD-10-CM | POA: Diagnosis present

## 2010-09-19 DIAGNOSIS — Z8711 Personal history of peptic ulcer disease: Secondary | ICD-10-CM

## 2010-09-19 DIAGNOSIS — Z7982 Long term (current) use of aspirin: Secondary | ICD-10-CM

## 2010-09-19 DIAGNOSIS — M47812 Spondylosis without myelopathy or radiculopathy, cervical region: Secondary | ICD-10-CM | POA: Diagnosis present

## 2010-09-19 DIAGNOSIS — F329 Major depressive disorder, single episode, unspecified: Secondary | ICD-10-CM | POA: Diagnosis present

## 2010-09-19 DIAGNOSIS — E785 Hyperlipidemia, unspecified: Secondary | ICD-10-CM | POA: Diagnosis present

## 2010-09-19 DIAGNOSIS — J309 Allergic rhinitis, unspecified: Secondary | ICD-10-CM | POA: Diagnosis present

## 2010-09-19 LAB — COMPREHENSIVE METABOLIC PANEL
ALT: 29 U/L (ref 0–35)
AST: 28 U/L (ref 0–37)
Calcium: 9.4 mg/dL (ref 8.4–10.5)
GFR calc Af Amer: 55 mL/min — ABNORMAL LOW (ref >60)
Sodium: 138 mEq/L (ref 135–145)
Total Protein: 8 g/dL (ref 6.0–8.3)

## 2010-09-19 LAB — CBC
HCT: 40.6 % (ref 36.0–46.0)
Hemoglobin: 13.8 g/dL (ref 12.0–15.0)
MCV: 90.8 fL (ref 78.0–100.0)
RBC: 4.47 MIL/uL (ref 3.87–5.11)
RDW: 14 % (ref 11.5–15.5)
WBC: 7.4 10*3/uL (ref 4.0–10.5)

## 2010-09-19 LAB — DIFFERENTIAL
Basophils Absolute: 0 10*3/uL (ref 0.0–0.1)
Eosinophils Relative: 1 % (ref 0–5)
Lymphocytes Relative: 20 % (ref 12–46)
Lymphs Abs: 1.5 10*3/uL (ref 0.7–4.0)
Neutro Abs: 4.8 10*3/uL (ref 1.7–7.7)
Neutrophils Relative %: 65 % (ref 43–77)

## 2010-09-19 LAB — URINE MICROSCOPIC-ADD ON

## 2010-09-19 LAB — URINALYSIS, ROUTINE W REFLEX MICROSCOPIC
Hgb urine dipstick: NEGATIVE
Specific Gravity, Urine: 1.023 (ref 1.005–1.030)
Urobilinogen, UA: 0.2 mg/dL (ref 0.0–1.0)
pH: 6 (ref 5.0–8.0)

## 2010-09-19 LAB — POCT CARDIAC MARKERS
CKMB, poc: <1 ng/mL — ABNORMAL LOW (ref 1.0–8.0)
Myoglobin, poc: 151 ng/mL (ref 12–200)
Troponin i, poc: <0.05 ng/mL (ref 0.00–0.09)
Troponin i, poc: <0.05 ng/mL (ref 0.00–0.09)

## 2010-09-19 LAB — CARDIAC PANEL(CRET KIN+CKTOT+MB+TROPI)
CK, MB: 1.9 ng/mL (ref 0.3–4.0)
Relative Index: 1.5 (ref 0.0–2.5)
Troponin I: 0.01 ng/mL (ref 0.00–0.06)

## 2010-09-20 LAB — COMPREHENSIVE METABOLIC PANEL
ALT: 25 U/L (ref 0–35)
BUN: 11 mg/dL (ref 6–23)
Calcium: 8 mg/dL — ABNORMAL LOW (ref 8.4–10.5)
Creatinine, Ser: 0.84 mg/dL (ref 0.4–1.2)
GFR calc non Af Amer: >60 mL/min (ref >60)
Glucose, Bld: 85 mg/dL (ref 70–99)
Sodium: 139 mEq/L (ref 135–145)
Total Protein: 5.9 g/dL — ABNORMAL LOW (ref 6.0–8.3)

## 2010-09-20 LAB — DIFFERENTIAL
Basophils Absolute: 0 10*3/uL (ref 0.0–0.1)
Eosinophils Relative: 1 % (ref 0–5)
Lymphs Abs: 1.7 10*3/uL (ref 0.7–4.0)
Monocytes Absolute: 0.9 10*3/uL (ref 0.1–1.0)
Neutro Abs: 3.9 10*3/uL (ref 1.7–7.7)

## 2010-09-20 LAB — CARDIAC PANEL(CRET KIN+CKTOT+MB+TROPI)
CK, MB: 1.9 ng/mL (ref 0.3–4.0)
Total CK: 125 U/L (ref 7–177)
Troponin I: 0.02 ng/mL (ref 0.00–0.06)

## 2010-09-20 LAB — MAGNESIUM: Magnesium: 2.1 mg/dL (ref 1.5–2.5)

## 2010-09-20 LAB — PHOSPHORUS: Phosphorus: 3.9 mg/dL (ref 2.3–4.6)

## 2010-09-20 LAB — CBC
HCT: 33.5 % — ABNORMAL LOW (ref 36.0–46.0)
MCH: 31 pg (ref 26.0–34.0)
MCHC: 34.3 g/dL (ref 30.0–36.0)
RDW: 13.9 % (ref 11.5–15.5)

## 2010-09-21 DIAGNOSIS — R079 Chest pain, unspecified: Secondary | ICD-10-CM

## 2010-09-21 DIAGNOSIS — K5732 Diverticulitis of large intestine without perforation or abscess without bleeding: Secondary | ICD-10-CM

## 2010-09-21 LAB — HEMOCCULT GUIAC POC 1CARD (OFFICE): Fecal Occult Bld: NEGATIVE

## 2010-09-22 DIAGNOSIS — R079 Chest pain, unspecified: Secondary | ICD-10-CM

## 2010-09-22 DIAGNOSIS — K5732 Diverticulitis of large intestine without perforation or abscess without bleeding: Secondary | ICD-10-CM

## 2010-09-22 LAB — CBC
HCT: 33.9 % — ABNORMAL LOW (ref 36.0–46.0)
Hemoglobin: 11.4 g/dL — ABNORMAL LOW (ref 12.0–15.0)
MCV: 90.4 fL (ref 78.0–100.0)
RBC: 3.75 MIL/uL — ABNORMAL LOW (ref 3.87–5.11)
WBC: 5.1 10*3/uL (ref 4.0–10.5)

## 2010-09-22 LAB — BASIC METABOLIC PANEL
BUN: 5 mg/dL — ABNORMAL LOW (ref 6–23)
Chloride: 108 mEq/L (ref 96–112)
GFR calc non Af Amer: 58 mL/min — ABNORMAL LOW (ref >60)
Glucose, Bld: 106 mg/dL — ABNORMAL HIGH (ref 70–99)
Potassium: 3.5 mEq/L (ref 3.5–5.1)
Sodium: 141 mEq/L (ref 135–145)

## 2010-09-23 ENCOUNTER — Telehealth: Payer: Self-pay | Admitting: Internal Medicine

## 2010-09-23 NOTE — Assessment & Plan Note (Signed)
Summary: lipid rov/eac   Visit Type:  Follow-up   [name] presents today for follow-up lipid consult.    Lipid goals are TC <200, TG <150, LDL <100 with optional <70, HDL >50.    In the past,patient has tried vytorin and lipitor.  Both caused significant muscle aches and cramps.  Symptoms were not particularly bothersome during the day, but were worse at night, especially upon lying down for bed.  Pt also experienced muscle soreness to the point it was difficult to get out of a chair.  She is currently taking Crestor 5mg  5 times a week.  She has started noticing occassional symptoms similar to those experienced before on this dose, but not to the extent that she cannot tolerate.  Patient is also taking Fish Oil 1000 mg three times a day.  She was not able to tolerate the Niacin.  She said it made her feel very clumsy and weak so she stopped taking it about 1 month ago.    Her current diet is very low in fat and cholesterol.  She has recently increased the amount of fish and fresh vegetables she has eaten because she has been at the beach.  She is also walking more often since her last visit.  She walks on the treadmill about 1/2hr at least 4 times a week.  She has also been walking at the beach recently.   Lipid Management Provider  Weston Brass, PharmD  Current Medications (verified): 1)  Multivitamins   Caps (Multiple Vitamin) .... Once Daily 2)  Calcium 600 1500 Mg  Tabs (Calcium Carbonate) .... Take 1 Tablet By Mouth Once A Day 3)  Lisinopril 20 Mg Tabs (Lisinopril) .Marland Kitchen.. 1 Tab Daily 4)  Aspirin Low Dose 81 Mg  Tabs (Aspirin) .... Once Daily 5)  Alendronate Sodium 70 Mg  Tabs (Alendronate Sodium) .... Take 1 Tablet By Mouth Once A Week 6)  Zyrtec Allergy 10 Mg  Tabs (Cetirizine Hcl) .... Once Daily As Needed 7)  Albuterol 90 Mcg/act  Aers (Albuterol) .... 2 Puffs Q 4 As Needed 8)  Crestor 5 Mg Tabs (Rosuvastatin Calcium) .... 5x Weekly 9)  Nitroglycerin 0.4 Mg  Subl (Nitroglycerin) .Marland Kitchen.. 1  Sublingual As Needed 10)  Qvar 40 Mcg/act  Aers (Beclomethasone Dipropionate) .... Inhale 2 Puffs Once Daily 11)  Nexium 40 Mg Cpdr (Esomeprazole Magnesium) .Marland Kitchen.. 1 By Mouth Once Daily 12)  Omega 3 1000 Mg  Caps (Omega-3 Fatty Acids) .... Take 3 Tablet By Mouth Once A Day 13)  Promethazine Hcl 12.5 Mg Tabs (Promethazine Hcl) .Marland Kitchen.. 1 By Mouth Q 6 As Needed Nausea. 14)  Zantac 150 Mg Tabs (Ranitidine Hcl) .Marland Kitchen.. 1 Tab By Mouth Once Daily  Allergies: 1)  ! Demerol 2)  ! Valium 3)  ! Augmentin 4)  ! Sulfamethoxazole-Trimethoprim (Sulfamethoxazole-Trimethoprim) 5)  ! Codeine   Vital Signs:  Patient profile:   77 year old female Height:      64 inches Weight:      143 pounds BMI:     24.63 BP sitting:   124 / 90 Cuff size:   regular  Impression & Recommendations:  Problem # 1:  HYPERLIPIDEMIA (ICD-272.4) Assessment Improved Pt's cholesterol panel has improved since taking Crestor 5mg  5 days of the week.  TC- 219 (goal <200), TG- 135 (goal <150), HDL- 61 (goal>45), and LDL 127 (goal <70)  She was not able to tolerate the Niacin.  She is relucant to increase the Crestor since she has some leg pain  now and does not want this to get worse.  She asked if it was okay to take a Mango seed capsule that Dr. Neil Crouch on TV had recommended for cholesterol.  We will let her try this between now and the next visit to see if there is any improvement in her LDL.  Will continue her current diet and exercise routine and f/u with repeat labs in 3 months.    The following medications were removed from the medication list:    Slo-niacin 500 Mg Cr-tabs (Niacin) .Marland Kitchen... Take 1 tablet each evening, 30 min after taking aspirin. Her updated medication list for this problem includes:    Crestor 5 Mg Tabs (Rosuvastatin calcium) .Marland Kitchen... 5x weekly  Patient Instructions: 1)  Continue Crestor 5mg  5 days a week and fish oil 3 capsules daily 2)  It will be okay to try Mango seed capsule twice daily 3)  Continue current diet and  exercise routine.  4)  Labs: 10/4 at [facility] office at 10am 5)  Lipid Clinic appt: 10/6 at 2pm

## 2010-09-23 NOTE — Assessment & Plan Note (Signed)
Summary: NAUSEA/ WEAK / LOW FEVER/SORE THROAT/NWS   Vital Signs:  Patient profile:   77 year old female Height:      64 inches Weight:      146 pounds BMI:     25.15 O2 Sat:      96 % on Room air Temp:     97.6 degrees F oral Pulse rate:   94 / minute BP sitting:   132 / 86  (left arm) Cuff size:   regular  Vitals Entered By: Bill Salinas CMA (August 25, 2009 10:31 AM)  O2 Flow:  Room air CC: pt here with complaint of nausea, weakness, dry cough and sore throat. Pt states the nausea has been going on for months but the sore throat just started about 1 week ago/ ab   Primary Care Provider:  Sanja Elizardo  CC:  pt here with complaint of nausea, weakness, and dry cough and sore throat. Pt states the nausea has been going on for months but the sore throat just started about 1 week ago/ ab.  History of Present Illness: Patient presents for URI symptoms with sore throat, malaise and feeling ill. No documented fever, no rigors, no SOB.  She also has significant stress with family issues over property and money that is very distressing. She feels that this may be a cause of underlying nausea and vomiting. She has a h/o GI bleed and diverticulitis.   Current Medications (verified): 1)  Multivitamins   Caps (Multiple Vitamin) .... Once Daily 2)  Calcium 600 1500 Mg  Tabs (Calcium Carbonate) .... Take 1 Tablet By Mouth Once A Day 3)  Lisinopril 20 Mg Tabs (Lisinopril) .Marland Kitchen.. 1 Tab Daily 4)  Aspirin Low Dose 81 Mg  Tabs (Aspirin) .... Once Daily 5)  Alendronate Sodium 70 Mg  Tabs (Alendronate Sodium) .... Take 1 Tablet By Mouth Once A Week 6)  Zyrtec Allergy 10 Mg  Tabs (Cetirizine Hcl) .... Once Daily As Needed 7)  Albuterol 90 Mcg/act  Aers (Albuterol) .... 2 Puffs Q 4 As Needed 8)  Crestor 5 Mg Tabs (Rosuvastatin Calcium) .... 3x Weekly 9)  Nitroglycerin 0.4 Mg  Subl (Nitroglycerin) .Marland Kitchen.. 1 Sublingual As Needed 10)  Qvar 40 Mcg/act  Aers (Beclomethasone Dipropionate) .... One  Puff Twice  Daily 11)  Prilosec 20 Mg Cpdr (Omeprazole) .Marland Kitchen.. 1 Tab By Mouth Once Daily 12)  Omega 3 1000 Mg  Caps (Omega-3 Fatty Acids) .... Take 2 Tablet By Mouth Once A Day  Allergies (verified): 1)  ! Demerol 2)  ! Valium 3)  ! Augmentin 4)  ! Sulfamethoxazole-Trimethoprim (Sulfamethoxazole-Trimethoprim)  Past History:  Past Medical History: Last updated: 05/15/2009 Breast cancer, hx of Peptic ulcer disease IBS DJD cervical spine RLL BOOP '05 hx of Diverticulitis Cystitis chronic rhinitis CAD s/p DES to Lcx 2005 Hyperlipidemia Hypertension Palpitations Depression  Past Surgical History: Last updated: 03/16/2007 right tramflam reconstruction-breast vein stripping ventral hernia repair '02 arthroplasty right thumb '02 Cholecystectomy Hysterectomy Mastectomy Oophorectomy  Family History: Last updated: 10/28/2008 Asthma Breast Cancer COPD:  father died of Lung Cancer Mother died at 31 of heart problems  Social History: widowed.  Has several children 3 dtrs, 2 sons and several grandchildren in  [place] and [address] She sees them often. Lives independently. Is very active.  Tobacco Use - No.   Review of Systems       The patient complains of weight loss, prolonged cough, abdominal pain, severe indigestion/heartburn, and depression.  The patient denies anorexia, fever, weight  gain, vision loss, decreased hearing, hoarseness, chest pain, syncope, dyspnea on exertion, peripheral edema, headaches, hemoptysis, melena, hematochezia, hematuria, muscle weakness, suspicious skin lesions, abnormal bleeding, and angioedema.    Physical Exam  General:  alert, well-developed, well-nourished, and well-hydrated.   Head:  normocephalic and atraumatic.  Mild sinus tenderness Eyes:  pupils equal, pupils round, corneas and lenses clear, and no injection.   Ears:  R ear normal and L ear normal.   Mouth:  throat with erythema but no exudate Neck:  supple and full ROM.   Lungs:  normal  respiratory effort and normal breath sounds.   Heart:  normal rate, regular rhythm, and no murmur.   Abdomen:  soft, normal bowel sounds, no distention, and no masses.  Very tender in the epigastrum and LUQ Msk:  normal ROM, no joint tenderness, and no joint swelling.   Pulses:  2+ radial Neurologic:  alert & oriented X3, cranial nerves II-XII intact, strength normal in all extremities, and gait normal.   Skin:  turgor normal and color normal.   Cervical Nodes:  no anterior cervical adenopathy and no posterior cervical adenopathy.   Psych:  Oriented X3, memory intact for recent and remote, normally interactive, and good eye contact.  Visibly upset as she relates her family issues to me - depressed by the situation and feeling helpless/hopeless. No suicidal ideation.    Impression & Recommendations:  Problem # 1:  URI (ICD-465.9) Patient with URI  Plan - Z-pak as directed           continue zyrtec  Her updated medication list for this problem includes:    Aspirin Low Dose 81 Mg Tabs (Aspirin) ..... Once daily    Zyrtec Allergy 10 Mg Tabs (Cetirizine hcl) ..... Once daily as needed    Promethazine Hcl 12.5 Mg Tabs (Promethazine hcl) .Marland Kitchen... 1 by mouth q 6 as needed nausea.  Problem # 2:  PEPTIC ULCER DISEASE (ICD-533.90) Patient with recurrent dyspepsia without sign of bleeding ulcer. She is very tender.  Plan - increase PPI to full therapeutic dose, i.e. omeprazole 40mg  or nexium 40 q AM           Zantac 150 by mouth qPM  Her updated medication list for this problem includes:    Nexium 40 Mg Cpdr (Esomeprazole magnesium) .Marland Kitchen... 1 by mouth once daily  Problem # 3:  ANXIETY DEPRESSION (ICD-300.4) marked anxiety with depression related to relationship problems with daughters centered on property and money.  Plan - return to Dr. Dellia Cloud for counseling.   Complete Medication List: 1)  Multivitamins Caps (Multiple vitamin) .... Once daily 2)  Calcium 600 1500 Mg Tabs (Calcium  carbonate) .... Take 1 tablet by mouth once a day 3)  Lisinopril 20 Mg Tabs (Lisinopril) .Marland Kitchen.. 1 tab daily 4)  Aspirin Low Dose 81 Mg Tabs (Aspirin) .... Once daily 5)  Alendronate Sodium 70 Mg Tabs (Alendronate sodium) .... Take 1 tablet by mouth once a week 6)  Zyrtec Allergy 10 Mg Tabs (Cetirizine hcl) .... Once daily as needed 7)  Albuterol 90 Mcg/act Aers (Albuterol) .... 2 puffs q 4 as needed 8)  Crestor 5 Mg Tabs (Rosuvastatin calcium) .... 3x weekly 9)  Nitroglycerin 0.4 Mg Subl (Nitroglycerin) .Marland Kitchen.. 1 sublingual as needed 10)  Qvar 40 Mcg/act Aers (Beclomethasone dipropionate) .... One  puff twice daily 11)  Nexium 40 Mg Cpdr (Esomeprazole magnesium) .Marland Kitchen.. 1 by mouth once daily 12)  Omega 3 1000 Mg Caps (Omega-3 fatty acids) .... Take 2  tablet by mouth once a day 13)  Promethazine Hcl 12.5 Mg Tabs (Promethazine hcl) .Marland Kitchen.. 1 by mouth q 6 as needed nausea. 14)  Zithromax Z-pak 250 Mg Tabs (Azithromycin) .... As directed (generic is fine)  Patient Instructions: 1)  Stomach - exam and history suggest significant gastritis. Plan - change to nexium 40mg  every AM and otc Zantac 150 mg after supper. Call if you don't see improvement over the next 2 weeks. 2)  Sore throat - throat is red. Lungs are clear. Plan - Z-pak as directed. Gargle of choice. 3)  Stress - very difficult situation. Please call for appointment with Dr. Dellia Cloud.  Prescriptions: ZITHROMAX Z-PAK 250 MG TABS (AZITHROMYCIN) as directed (generic is fine)  #1 x 0   Entered and Authorized by:   Jacques Navy MD   Signed by:   Jacques Navy MD on 08/25/2009   Method used:   Electronically to        [facility]* (retail)       [address]       [place], [place]  [address]       Ph: [phone]       Fax: [phone]   RxID:   [id number] NEXIUM 40 MG CPDR (ESOMEPRAZOLE MAGNESIUM) 1 by mouth once daily  #30 x 12   Entered and Authorized by:   Jacques Navy MD   Signed by:   Jacques Navy MD on  08/25/2009   Method used:   Electronically to        [facility]* (retail)       [address]       [place], [place]  [id number]       Ph: [phone]       Fax: [phone]   RxID:   [id number] PROMETHAZINE HCL 12.5 MG TABS (PROMETHAZINE HCL) 1 by mouth q 6 as needed nausea.  #15 x 3   Entered and Authorized by:   Jacques Navy MD   Signed by:   Jacques Navy MD on 08/25/2009   Method used:   Electronically to        [facility]* (retail)       [address]       [place], [place]  [address]       Ph: [phone]       Fax: [phone]   RxID:   [id number]

## 2010-09-23 NOTE — Progress Notes (Signed)
Summary: SHINGLES?   Phone Note Call from Patient Call back at [phone]   Summary of Call: Patient is requesting a call back regarding shingles. She is out of town at [employer].  Initial call taken by: Lamar Sprinkles, CMA,  January 28, 2010 12:52 PM  Follow-up for Phone Call        Pt says that she has had shingles in the past. C/o painful rash that started 2 days ago across ribcage. Patient is requesting rx.  Follow-up by: Lamar Sprinkles, CMA,  January 28, 2010 1:14 PM  Additional Follow-up for Phone Call Additional follow up Details #1::        Patient out of town. Best would be to have rash looked at. But...if no access to a doctor - valtrex (low risk profile) 1000mg  two times a day x 7 - #14, prednisone 20mg  once daily x 7-#7 Additional Follow-up by: Jacques Navy MD,  January 28, 2010 4:09 PM    Additional Follow-up for Phone Call Additional follow up Details #2::    Left mess w/daughter in law to check w/pharmacy. Called into [phone] Follow-up by: Lamar Sprinkles, CMA,  January 28, 2010 4:31 PM  New/Updated Medications: VALTREX 1 GM TABS (VALACYCLOVIR HCL) 1 two times a day x 7 days PREDNISONE 20 MG TABS (PREDNISONE) 1 once daily Prescriptions: PREDNISONE 20 MG TABS (PREDNISONE) 1 once daily  #7 x 0   Entered by:   Lamar Sprinkles, CMA   Authorized by:   Jacques Navy MD   Signed by:   Lamar Sprinkles, CMA on 01/28/2010   Method used:   Historical   RxID:   [id number] VALTREX 1 GM TABS (VALACYCLOVIR HCL) 1 two times a day x 7 days  #14 x 0   Entered by:   Lamar Sprinkles, CMA   Authorized by:   Jacques Navy MD   Signed by:   Lamar Sprinkles, CMA on 01/28/2010   Method used:   Historical   RxID:   [id number]

## 2010-09-23 NOTE — Assessment & Plan Note (Signed)
Summary: EARACHE--BREATHING DIFFICULT -STC   Vital Signs:  Patient profile:   77 year old female Height:      64 inches Weight:      141 pounds BMI:     24.29 Temp:     98.2 degrees F BP sitting:   128 / 70  (left arm) Cuff size:   regular  Vitals Entered By: Lamar Sprinkles, CMA (July 12, 2010 10:05 AM) CC: Dx w/middle ear infection while on cruise, still has ear discomfort and non productive cough.  Comments Pt does not have albuterol inhaler, unsure if she should have this med.  Given prochloperazine and xylometazoline on cruize.    Primary Care Provider:  Norins  CC:  Dx w/middle ear infection while on cruise and still has ear discomfort and non productive cough. .  History of Present Illness: Spent hte last month in [place] caring for asick relative with dementia who did suffer hip an dpelvis fractures who did die. She returned home and went on a cruise and suffered in the infrimary for several days on the cruise.she was told that she had an infected ear. she did get IV fluids and antiemetics.  She also had a bout of breathing trouble that was responsive to Qvar. She had been taking ginger products to combat sea-sickness.  Since returning home she has continued to have a non-productive cough. She also reports feeling cold, having no appetite. Nausea has resolved. She was taking procholorzaine (sic).   Allergies: 1)  ! Demerol 2)  ! Valium 3)  ! Augmentin 4)  ! Sulfamethoxazole-Trimethoprim (Sulfamethoxazole-Trimethoprim) 5)  ! Codeine  Past History:  Past Medical History: Last updated: 05/15/2009 Breast cancer, hx of Peptic ulcer disease IBS DJD cervical spine RLL BOOP '05 hx of Diverticulitis Cystitis chronic rhinitis CAD s/p DES to Lcx 2005 Hyperlipidemia Hypertension Palpitations Depression  Past Surgical History: Last updated: 03/16/2007 right tramflam reconstruction-breast vein stripping ventral hernia repair '02 arthroplasty right thumb  '02 Cholecystectomy Hysterectomy Mastectomy Oophorectomy  Family History: Last updated: 10/28/2008 Asthma Breast Cancer COPD:  father died of Lung Cancer Mother died at 74 of heart problems  Social History: Last updated: 08/25/2009 widowed.  Has several children 3 dtrs, 2 sons and several grandchildren in  [place] and [address] She sees them often. Lives independently. Is very active.  Tobacco Use - No.   Review of Systems       The patient complains of abdominal pain.  The patient denies anorexia, fever, weight loss, weight gain, decreased hearing, chest pain, syncope, dyspnea on exertion, prolonged cough, severe indigestion/heartburn, genital sores, muscle weakness, difficulty walking, depression, abnormal bleeding, and angioedema.    Physical Exam  General:  WNWD white female in no acute distress Head:  normocephalic, atraumatic, and no abnormalities observed.  Tendr to percussion over the left maxillary sinus.  Eyes:  vision grossly intact, pupils equal, pupils round, and corneas and lenses clear.   Ears:  right TM rosy and retracted. Left TM normal Mouth:  posterior pharynx is clear Neck:  supple.   Lungs:  normal respiratory effort, normal breath sounds, no crackles, and no wheezes.   Heart:  normal rate and regular rhythm.   Abdomen:  soft, non-tender, normal bowel sounds, no guarding, and no rigidity.   Msk:  normal ROM, no joint swelling, and no joint warmth.   Pulses:  2+ pulse radial pulse Neurologic:  alert & oriented X3, cranial nerves II-XII intact, and strength normal in all extremities.     Impression &  Recommendations:  Problem # 1:  OTITIS MEDIA, ACUTE, RIGHT (ICD-382.9) Mild residual changes right TM and sinus tenderness  Plan - amoxicillin 875 mg two times a day x 7  Her updated medication list for this problem includes:    Aspirin Low Dose 81 Mg Tabs (Aspirin) ..... Once daily    Amoxicillin 875 Mg Tabs (Amoxicillin) .Marland Kitchen... 1 by mouth two times  a day x 7 days for otitis media  Problem # 2:  COUGH (ICD-786.2) persistent non-productive cough.  Plan - tessalon perles 100mg  three times a day            prednisone 10 mg  2 once daily x 3, then 1 once daily x 3           for acute wheezing albuterol mdi as needed   Complete Medication List: 1)  Multivitamins Caps (Multiple vitamin) .... Once daily 2)  Calcium 600 1500 Mg Tabs (Calcium carbonate) .... Take 1 tablet by mouth once a day 3)  Lisinopril 20 Mg Tabs (Lisinopril) .Marland Kitchen.. 1 tab daily 4)  Aspirin Low Dose 81 Mg Tabs (Aspirin) .... Once daily 5)  Alendronate Sodium 70 Mg Tabs (Alendronate sodium) .... Take 1 tablet by mouth once a week 6)  Zyrtec Allergy 10 Mg Tabs (Cetirizine hcl) .... Once daily as needed 7)  Albuterol 90 Mcg/act Aers (Albuterol) .... 2 puffs q 4 as needed 8)  Crestor 5 Mg Tabs (Rosuvastatin calcium) .... 5x weekly 9)  Nitroglycerin 0.4 Mg Subl (Nitroglycerin) .Marland Kitchen.. 1 sublingual as needed 10)  Qvar 40 Mcg/act Aers (Beclomethasone dipropionate) .... Inhale 2 puffs once daily 11)  Nexium 40 Mg Cpdr (Esomeprazole magnesium) .Marland Kitchen.. 1 by mouth once daily 12)  Omega 3 1000 Mg Caps (Omega-3 fatty acids) .... Take 3 tablet by mouth once a day 13)  Zantac 150 Mg Tabs (Ranitidine hcl) .Marland Kitchen.. 1 tab by mouth once daily 14)  Tessalon Perles 100 Mg Caps (Benzonatate) .Marland Kitchen.. 1 by mouth three times a day x 10 days for persistent tickle cough 15)  Amoxicillin 875 Mg Tabs (Amoxicillin) .Marland Kitchen.. 1 by mouth two times a day x 7 days for otitis media 16)  Prednisone 10 Mg Tabs (Prednisone) .... 2 tabs once daily x 3, 1 once daily x 3  Patient Instructions: 1)  Ear infection - a little rosy. Plan 0- amoxicillin 875mg  two times a day for 7 days 2)  cough - more tracheal irritation type cough. Plan prednisone 20mg  once daily x 3, then 10 mg once daily x 3; tessalon perles 100mg  three times a day x 10 days. 3)  for wheezing and tightness - use the albuterol inhaler 2 puffs up to 4 times a day. If  you need more than this you need to be seen. For allergen exposure you may want to use the Qvar during the period of time you will be exposed.  Prescriptions: QVAR 40 MCG/ACT  AERS (BECLOMETHASONE DIPROPIONATE) Inhale 2 puffs once daily  #1 x 1   Entered and Authorized by:   Jacques Navy MD   Signed by:   Jacques Navy MD on 07/12/2010   Method used:   Electronically to        [facility]* (retail)       [address]       [place], [place]  [address]       Ph: [phone]       Fax: [phone]   RxID:   [id number] ALBUTEROL 90 MCG/ACT  AERS (ALBUTEROL) 2 puffs q 4 as needed  #1 x 1   Entered and Authorized by:   Jacques Navy MD   Signed by:   Jacques Navy MD on 07/12/2010   Method used:   Electronically to        [facility]* (retail)       [address]       [place], [place]  [id number]       Ph: [phone]       Fax: [phone]   RxID:   [id number] PREDNISONE 10 MG TABS (PREDNISONE) 2 tabs once daily x 3, 1 once daily x 3  #9 x 0   Entered and Authorized by:   Jacques Navy MD   Signed by:   Jacques Navy MD on 07/12/2010   Method used:   Electronically to        [facility]* (retail)       [address]       [place], [place]  [id number]       Ph: [phone]       Fax: [phone]   RxID:   [id number] AMOXICILLIN 875 MG TABS (AMOXICILLIN) 1 by mouth two times a day x 7 days for otitis media  #21 x 0   Entered and Authorized by:   Jacques Navy MD   Signed by:   Jacques Navy MD on 07/12/2010   Method used:   Electronically to        [facility]* (retail)       [address]       [place], [place]  [address]       Ph: [phone]       Fax: [phone]   RxID:   [id number] TESSALON PERLES 100 MG CAPS (BENZONATATE) 1 by mouth three times a day x 10 days for persistent tickle cough  #30 x 1   Entered and Authorized by:   Jacques Navy MD   Signed by:    Jacques Navy MD on 07/12/2010   Method used:   Electronically to        [facility]* (retail)       [address]       [place], [place]  [address]       Ph: [phone]       Fax: [phone]   RxID:   [id number]    Orders Added: 1)  Est. Patient Level III [52778]

## 2010-09-23 NOTE — Assessment & Plan Note (Signed)
Summary: Cardiology Nuclear Study  Nuclear Med Background Indications for Stress Test: Evaluation for Ischemia, Stent Patency   History: Asthma, COPD, Echo, Heart Catheterization, Myocardial Perfusion Study, Stents  History Comments: '05 STENTS CFX 10/08 Heart Cath N/O CAD NL LVF patent stent CFX 10/08 MPS (-) scar/ischemia EF 79% 08/10 ECHO NL LVF EF 60% Diastolic Dysfunction  Symptoms: Chest Pain, Chest Pressure, DOE    Nuclear Pre-Procedure Cardiac Risk Factors: Carotid Disease, Family History - CAD, Hypertension, Lipids, Smoker Caffeine/Decaff Intake: None NPO After: 9:00 PM Lungs: clear IV 0.9% NS with Angio Cath: 22g     IV Site: (L) AC IV Started by: Irean Hong RN Chest Size (in) 38     Cup Size B     Height (in): 64 Weight (lb): 142 BMI: 24.46  Nuclear Med Study 1 or 2 day study:  1 day     Stress Test Type:  Stress Reading MD:  Willa Rough, MD     Referring MD:  B.Crenshaw Resting Radionuclide:  Technetium 65m Tetrofosmin     Resting Radionuclide Dose:  11.0 mCi  Stress Radionuclide:  Technetium 45m Tetrofosmin     Stress Radionuclide Dose:  33.0 mCi   Stress Protocol Exercise Time (min):  4:30 min     Max HR:  133 bpm     Predicted Max HR:  142 bpm  Max Systolic BP: 167 mm Hg     Percent Max HR:  93.66 %     METS: 6.3 Rate Pressure Product:  08657    Stress Test Technologist:  Milana Na EMT-P     Nuclear Technologist:  Burna Mortimer Deal RT-N  Rest Procedure  Myocardial perfusion imaging was performed at rest 45 minutes following the intravenous administration of Myoview Technetium 48m Tetrofosmin.  Stress Procedure  The patient exercised for 4:30. The patient stopped due to fatigue, sob, and denied any chest pain.  There were no significant ST-T wave changes and occ pvcs.  Myoview was injected at peak exercise and myocardial perfusion imaging was performed after a brief delay.  QPS Raw Data Images:  Patient motion noted; appropriate software correction  applied. Stress Images:  There is normal uptake in all areas. Rest Images:  Normal homogeneous uptake in all areas of the myocardium. Subtraction (SDS):  No evidence of ischemia. Transient Ischemic Dilatation:  .97  (Normal <1.22)  Lung/Heart Ratio:  31  (Normal <0.45)  Quantitative Gated Spect Images QGS EDV:  43 ml QGS ESV:  8 ml QGS EF:  81 % QGS cine images:  Normal motion  Findings Normal nuclear study      Overall Impression  Exercise Capacity: Fair exercise capacity. BP Response: Normal blood pressure response. Clinical Symptoms: SOB ECG Impression: No significant ST segment change suggestive of ischemia. Overall Impression: Normal stress nuclear study.  Appended Document: Cardiology Nuclear Study ok  Appended Document: Cardiology Nuclear Study pt aware of results

## 2010-09-23 NOTE — Assessment & Plan Note (Signed)
Summary: ACUTE/RCD   Vital Signs:  Patient profile:   77 year old female Weight:      143 pounds Temp:     98.4 degrees F oral Pulse rate:   102 / minute Pulse rhythm:   regular BP sitting:   122 / 80  (left arm)  Vitals Entered By: Lamar Sprinkles, CMA (September 18, 2010 12:07 PM) CC: ? diverticuliits, low abd pain across abd but more on left side. Had cipro at home and took 2 doses/SD   History of Present Illness: Patient is a 77 yo Caucasian female in today with abdominal pain. Thursday she had sudden onset of diffuse abdominal discomfort. She reports her bowels have continued to move daily and are still formed but have turned yellowish. No diarrhea/nausea or vomitting but she is struggling with anorexia. She does believe she has had some low grade fevers and chills. She has also been noting significant urinary frquency but she denies any incontinence, hematuria or dysuria. She notes she had an episode of diverticulitis many years ago but has never had a recurrence. She denies any CP/palp/SOB/HA/congestion.  Current Medications (verified): 1)  Multivitamins   Caps (Multiple Vitamin) .... Once Daily 2)  Calcium 600 1500 Mg  Tabs (Calcium Carbonate) .... Take 1 Tablet By Mouth Once A Day 3)  Lisinopril 20 Mg Tabs (Lisinopril) .Marland Kitchen.. 1 Tab Daily 4)  Aspirin Low Dose 81 Mg  Tabs (Aspirin) .... Once Daily 5)  Alendronate Sodium 70 Mg  Tabs (Alendronate Sodium) .... Take 1 Tablet By Mouth Once A Week 6)  Zyrtec Allergy 10 Mg  Tabs (Cetirizine Hcl) .... Once Daily As Needed 7)  Albuterol 90 Mcg/act  Aers (Albuterol) .... 2 Puffs Q 4 As Needed 8)  Crestor 5 Mg Tabs (Rosuvastatin Calcium) .... 5x Weekly 9)  Nitroglycerin 0.4 Mg  Subl (Nitroglycerin) .Marland Kitchen.. 1 Sublingual As Needed 10)  Qvar 40 Mcg/act  Aers (Beclomethasone Dipropionate) .... Inhale 2 Puffs Once Daily 11)  Nexium 40 Mg Cpdr (Esomeprazole Magnesium) .Marland Kitchen.. 1 By Mouth Once Daily 12)  Omega 3 1000 Mg  Caps (Omega-3 Fatty Acids) .... Take 3  Tablet By Mouth Once A Day 13)  Zantac 150 Mg Tabs (Ranitidine Hcl) .Marland Kitchen.. 1 Tab By Mouth Once Daily  Allergies (verified): 1)  ! Demerol 2)  ! Valium 3)  ! Augmentin 4)  ! Sulfamethoxazole-Trimethoprim (Sulfamethoxazole-Trimethoprim) 5)  ! Codeine  Past History:  Past medical history reviewed for relevance to current acute and chronic problems. Social history (including risk factors) reviewed for relevance to current acute and chronic problems.  Past Medical History: Reviewed history from 05/15/2009 and no changes required. Breast cancer, hx of Peptic ulcer disease IBS DJD cervical spine RLL BOOP '05 hx of Diverticulitis Cystitis chronic rhinitis CAD s/p DES to Lcx 2005 Hyperlipidemia Hypertension Palpitations Depression  Social History: Reviewed history from 08/25/2009 and no changes required. widowed.  Has several children 3 dtrs, 2 sons and several grandchildren in  [place] and [address] She sees them often. Lives independently. Is very active.  Tobacco Use - No.   Review of Systems      See HPI  Physical Exam  General:  Well-developed,well-nourished,in no acute distress; alert,appropriate and cooperative throughout examination Head:  Normocephalic and atraumatic without obvious abnormalities. No apparent alopecia or balding. Nose:  External nasal examination shows no deformity or inflammation. Nasal mucosa are pink and moist without lesions or exudates. Mouth:  Oral mucosa and oropharynx without lesions or exudates.   Neck:  No  deformities, masses, or tenderness noted. Lungs:  Normal respiratory effort, chest expands symmetrically. Lungs are clear to auscultation, no crackles or wheezes. Heart:  Normal rate and regular rhythm. S1 and S2 normal without gallop, click, rub or other extra sounds. Abdomen:  normal bowel sounds, no distention, no rigidity, no rebound tenderness, RLQ tenderness, and LUQ tenderness.   Msk:  No deformity or scoliosis noted of thoracic  or lumbar spine.   Cervical Nodes:  No lymphadenopathy noted Psych:  Cognition and judgment appear intact. Alert and cooperative with normal attention span and concentration. No apparent delusions, illusions, hallucinations   Impression & Recommendations:  Problem # 1:  DIVERTICULITIS, ACUTE (ICD-562.11) Given an rx for Ciprofloxacin and Flagyl. She will push clear fluids and seek immediate care if symptoms worsen. Encouraged her to start a probiotic as well  Problem # 2:  ESSENTIAL HYPERTENSION, BENIGN (ICD-401.1)  Her updated medication list for this problem includes:    Lisinopril 20 Mg Tabs (Lisinopril) .Marland Kitchen... 1 tab daily Adequate control no change in therapy  Complete Medication List: 1)  Multivitamins Caps (Multiple vitamin) .... Once daily 2)  Calcium 600 1500 Mg Tabs (Calcium carbonate) .... Take 1 tablet by mouth once a day 3)  Lisinopril 20 Mg Tabs (Lisinopril) .Marland Kitchen.. 1 tab daily 4)  Aspirin Low Dose 81 Mg Tabs (Aspirin) .... Once daily 5)  Alendronate Sodium 70 Mg Tabs (Alendronate sodium) .... Take 1 tablet by mouth once a week 6)  Zyrtec Allergy 10 Mg Tabs (Cetirizine hcl) .... Once daily as needed 7)  Albuterol 90 Mcg/act Aers (Albuterol) .... 2 puffs q 4 as needed 8)  Crestor 5 Mg Tabs (Rosuvastatin calcium) .... 5x weekly 9)  Nitroglycerin 0.4 Mg Subl (Nitroglycerin) .Marland Kitchen.. 1 sublingual as needed 10)  Qvar 40 Mcg/act Aers (Beclomethasone dipropionate) .... Inhale 2 puffs once daily 11)  Nexium 40 Mg Cpdr (Esomeprazole magnesium) .Marland Kitchen.. 1 by mouth once daily 12)  Omega 3 1000 Mg Caps (Omega-3 fatty acids) .... Take 3 tablet by mouth once a day 13)  Zantac 150 Mg Tabs (Ranitidine hcl) .Marland Kitchen.. 1 tab by mouth once daily 14)  Cipro 500 Mg Tabs (Ciprofloxacin hcl) .Marland Kitchen.. 1 tab by mouth two times a day x 10 days 15)  Flagyl 500 Mg Tabs (Metronidazole) .Marland Kitchen.. 1 tab by mouth three times a day x 10 days  Patient Instructions: 1)  Take your antibiotic as prescribed until ALL of it is gone,  but stop if you develop a rash or swelling and contact our office as soon as possible.  2)  The main problem with gastroentereritis is dehydration. Drink plenty of fluids and take solids as you feel better. If you are unable to keep anything down and/or you show signs of dehydration( dry cracked lips, lack of tears, not urinating, very sleepy) , call our office.  3)  Please schedule a follow-up appointment as needed with  PMD if symptoms not resolving. Seek immediate medical care if worse. Prescriptions: FLAGYL 500 MG TABS (METRONIDAZOLE) 1 tab by mouth three times a day x 10 days  #30 x 0   Entered and Authorized by:   Danise Edge MD   Signed by:   Danise Edge MD on 09/18/2010   Method used:   Electronically to        [facility]* (retail)       [address]       [place], [place]  [id number]       Ph: [phone]  Fax: [phone]   RxID:   [id number] CIPRO 500 MG TABS (CIPROFLOXACIN HCL) 1 tab by mouth two times a day x 10 days  #20 x 0   Entered and Authorized by:   Danise Edge MD   Signed by:   Danise Edge MD on 09/18/2010   Method used:   Electronically to        [facility]* (retail)       [address]       [place], [place]  [id number]       Ph: [phone]       Fax: [phone]   RxID:   [id number]    Orders Added: 1)  Est. Patient Level III [875[address]]

## 2010-09-23 NOTE — Progress Notes (Signed)
  Phone Note Other Incoming   Summary of Call: pt will be due for tetanus and pneumonia. Pt has never had a colonoscopy and no longer gets yearly paps.  Initial call taken by: Ami Bullins CMA,  September 08, 2009 1:52 PM      Preventive Care Screening  Mammogram:    Date:  05/26/2009    Results:  normal  Last Flu Shot:    Date:  05/26/2009    Results:  given  Bone Density:    Date:  05/16/2007    Results:  abnormal    Immunization History:  Zostavax History:    Zostavax:  zostavax (07/24/2006)

## 2010-09-23 NOTE — Assessment & Plan Note (Signed)
Summary: per check out/sf  Medications Added QVAR 40 MCG/ACT  AERS (BECLOMETHASONE DIPROPIONATE) as needed OMEPRAZOLE 40 MG CPDR (OMEPRAZOLE) 1 tab by mouth once daily ZANTAC 150 MG TABS (RANITIDINE HCL) 1 tab by mouth once daily        Referring Provider:  Olga Millers Primary Provider:  Norins   History of Present Illness: Pleasant female I have seen previously for coronary disease. She does have a history of drug-eluting stent to the circumflex in 2005. Her last Myoview in October of 2008 was normal with no scar or ischemia and an ejection fraction of 79%. A CT showed no pulmonary embolus at that time. Finally a cardiac catheterization was performed on June 21, 2007. Her LV function was normal and she had minor plaquing in her coronaries but no obstructive disease was noted. Last carotid Dopplers were performed in November of 2009 and showed 0-39% bilateral stenosis. Followup was recommended in 2 years.  An echocardiogram in August of 2010 showed normal LV function and diastolic dysfunction. I last saw her in September of 2010. Since then she has mild dyspnea on exertion relieved with rest. It is not associated with chest pain. There is no orthopnea, PND, pedal edema, palpitations or syncope. She does occasionally have chest pain. She notices it more at night. It is described as a pressure. She does feel water brash at times. It can last all day at times. It does not occur with exertion. There is no associated symptoms.  Current Medications (verified): 1)  Multivitamins   Caps (Multiple Vitamin) .... Once Daily 2)  Calcium 600 1500 Mg  Tabs (Calcium Carbonate) .... Take 1 Tablet By Mouth Once A Day 3)  Lisinopril 20 Mg Tabs (Lisinopril) .Marland Kitchen.. 1 Tab Daily 4)  Aspirin Low Dose 81 Mg  Tabs (Aspirin) .... Once Daily 5)  Alendronate Sodium 70 Mg  Tabs (Alendronate Sodium) .... Take 1 Tablet By Mouth Once A Week 6)  Zyrtec Allergy 10 Mg  Tabs (Cetirizine Hcl) .... Once Daily As Needed 7)   Albuterol 90 Mcg/act  Aers (Albuterol) .... 2 Puffs Q 4 As Needed 8)  Crestor 5 Mg Tabs (Rosuvastatin Calcium) .... 3x Weekly 9)  Nitroglycerin 0.4 Mg  Subl (Nitroglycerin) .Marland Kitchen.. 1 Sublingual As Needed 10)  Qvar 40 Mcg/act  Aers (Beclomethasone Dipropionate) .... As Needed 11)  Nexium 40 Mg Cpdr (Esomeprazole Magnesium) .Marland Kitchen.. 1 By Mouth Once Daily 12)  Omega 3 1000 Mg  Caps (Omega-3 Fatty Acids) .... Take 2 Tablet By Mouth Once A Day 13)  Promethazine Hcl 12.5 Mg Tabs (Promethazine Hcl) .Marland Kitchen.. 1 By Mouth Q 6 As Needed Nausea. 14)  Omeprazole 40 Mg Cpdr (Omeprazole) .Marland Kitchen.. 1 Tab By Mouth Once Daily 15)  Zantac 150 Mg Tabs (Ranitidine Hcl) .Marland Kitchen.. 1 Tab By Mouth Once Daily  Allergies: 1)  ! Demerol 2)  ! Valium 3)  ! Augmentin 4)  ! Sulfamethoxazole-Trimethoprim (Sulfamethoxazole-Trimethoprim)  Past History:  Past Medical History: Reviewed history from 05/15/2009 and no changes required. Breast cancer, hx of Peptic ulcer disease IBS DJD cervical spine RLL BOOP '05 hx of Diverticulitis Cystitis chronic rhinitis CAD s/p DES to Lcx 2005 Hyperlipidemia Hypertension Palpitations Depression  Past Surgical History: Reviewed history from 03/16/2007 and no changes required. right tramflam reconstruction-breast vein stripping ventral hernia repair '02 arthroplasty right thumb '02 Cholecystectomy Hysterectomy Mastectomy Oophorectomy  Social History: Reviewed history from 08/25/2009 and no changes required. widowed.  Has several children 3 dtrs, 2 sons and several grandchildren in  [place]. She sees them often. Lives independently. Is very active.  Tobacco Use - No.   Review of Systems       no fevers or chills, productive cough, hemoptysis, dysphasia, odynophagia, melena, hematochezia, dysuria, hematuria, rash, seizure activity, orthopnea, PND, pedal edema, claudication. Remaining systems are negative.   Vital Signs:  Patient profile:   77 year old female Height:       64 inches Weight:      145 pounds BMI:     24.98 Pulse rate:   92 / minute Resp:     12 per minute BP sitting:   113 / 74  (left arm)  Vitals Entered By: Kem Parkinson (November 05, 2009 11:38 AM)  Physical Exam  General:  Well developed/well nourished in NAD Skin warm/dry Patient not depressed No peripheral clubbing Back-normal HEENT-normal/normal eyelids Neck supple/normal carotid upstroke bilaterally; no bruits; no JVD; no thyromegaly chest - CTA/ normal expansion CV - RRR/normal S1 and S2; no murmurs, rubs or gallops; no rub; PMI nondisplaced Abdomen -NT/ND, no HSM, no mass, + bowel sounds, no bruit 2+ femoral pulses, no bruits Ext-no edema, chords, 2+ DP Neuro-grossly nonfocal       EKG  Procedure date:  11/05/2009  Findings:      Normal sinus rhythm at a rate of 92. RV conduction delay. Cannot rule out prior septal infarct.  Impression & Recommendations:  Problem # 1:  CHEST PAIN, PRECORDIAL (ICD-786.51) Symptoms seem most consistent with reflux. However given history of coronary disease we'll plan Myoview for risk stratification. Her updated medication list for this problem includes:    Lisinopril 20 Mg Tabs (Lisinopril) .Marland Kitchen... 1 tab daily    Aspirin Low Dose 81 Mg Tabs (Aspirin) ..... Once daily    Nitroglycerin 0.4 Mg Subl (Nitroglycerin) .Marland Kitchen... 1 sublingual as needed  Problem # 2:  CAROTID ARTERY STENOSIS (ICD-433.10) Continue aspirin and statin. Followup carotid Dopplers November of 2011. Her updated medication list for this problem includes:    Aspirin Low Dose 81 Mg Tabs (Aspirin) ..... Once daily  Problem # 3:  PALPITATIONS (ICD-785.1) Quiescent and on no medications at present. Her updated medication list for this problem includes:    Lisinopril 20 Mg Tabs (Lisinopril) .Marland Kitchen... 1 tab daily    Aspirin Low Dose 81 Mg Tabs (Aspirin) ..... Once daily    Nitroglycerin 0.4 Mg Subl (Nitroglycerin) .Marland Kitchen... 1 sublingual as needed  Problem # 4:  ESSENTIAL  HYPERTENSION, BENIGN (ICD-401.1) Blood pressure controlled on present medications. Will continue. Her updated medication list for this problem includes:    Lisinopril 20 Mg Tabs (Lisinopril) .Marland Kitchen... 1 tab daily    Aspirin Low Dose 81 Mg Tabs (Aspirin) ..... Once daily  Problem # 5:  CORONARY ATHEROSCLEROSIS NATIVE CORONARY ARTERY (ICD-414.01) Continue aspirin, ACE inhibitor and statin. Her updated medication list for this problem includes:    Lisinopril 20 Mg Tabs (Lisinopril) .Marland Kitchen... 1 tab daily    Aspirin Low Dose 81 Mg Tabs (Aspirin) ..... Once daily    Nitroglycerin 0.4 Mg Subl (Nitroglycerin) .Marland Kitchen... 1 sublingual as needed  Orders: Nuclear Stress Test (Nuc Stress Test)  Problem # 6:  HYPERLIPIDEMIA (ICD-272.4) Continue statin. Check lipids and liver. Her updated medication list for this problem includes:    Crestor 5 Mg Tabs (Rosuvastatin calcium) .Marland Kitchen... 3x weekly  Problem # 7:  IRRITABLE BOWEL SYNDROME, HX OF (ICD-V12.79)  Patient Instructions: 1)  Your physician recommends that you schedule a follow-up appointment in: 9 MONTHS 2)  Your physician recommends  that you return for lab work ZO:XWRU STRESS TEST-LIPID/LIVER/BMP-272.0/V58.69/786.51 3)  Your physician has requested that you have an adenosine myoview.  For further information please visit [email or web address]  Please follow instruction sheet, as given.

## 2010-09-23 NOTE — Letter (Signed)
   [facility] [address] [place], [place]  [address] Phone: [phone]      September 21, 2009   [facility] [address] [place], [place] [address]  RE:  LAB RESULTS  Dear  Ms. Studley,  The following is an interpretation of your most recent lab tests.  Please take note of any instructions provided or changes to medications that have resulted from your lab work.    Bone density study reveal osteopenia at back and hips with 10 year fracture risk just above normal.   Plan - continue alendronate therapy (fosamax). Repeat study in 2 years.    Sincerely Yours,    Jacques Navy MD

## 2010-09-23 NOTE — Progress Notes (Signed)
----   Converted from flag ---- ---- 12/17/2009 8:39 AM, Edman Circle wrote: appt 5/5 @ 10:00  ---- 12/17/2009 8:06 AM, Dagoberto Reef wrote: Thanks  ---- 12/16/2009 4:56 PM, Jacques Navy MD wrote: The following orders have been entered for this patient and placed on Admin Hold:  Type:     Referral       Code:   Cardiology Description:   Cardiology Referral Order Date:   12/16/2009   Authorized By:   Jacques Navy MD Order #:   [id number] Clinical Notes:   schedule [name] for lipid clinic. Thanks ------------------------------

## 2010-09-23 NOTE — Assessment & Plan Note (Signed)
Summary: lipid      Allergies Added:        [name] presents today for follow-up lipid consult. She does not complain of chest pain, SOB, muscle pain or muscle aches.  She is tolerating Crestor 5mg  five days a week as well as fish oil 3gm daily.  She has been out of town for the past month or so due to the death of a family member.  She reports running out of Crestor samples during that time and did not call to get a prescription so she has not taken this in about a month.        Her typical diet is very low in fat and cholesterol.  Unfortunately due to the death in the family, she has been eating a lot of hospital food and whatever other people have brought to the house.  She does look forward to getting back to her normal routine and diet.  Of note, she is leaving on a cruise tomorrow and then will be gone to visit family for the holidays so it may be several weeks before she is able to resume her normal schedule.  Exercise has also been difficult to do during this time.   Lipid Management Provider  [name], PharmD  Current Medications (verified): 1)  Multivitamins   Caps (Multiple Vitamin) .... Once Daily 2)  Calcium 600 1500 Mg  Tabs (Calcium Carbonate) .... Take 1 Tablet By Mouth Once A Day 3)  Lisinopril 20 Mg Tabs (Lisinopril) .Marland Kitchen.. 1 Tab Daily 4)  Aspirin Low Dose 81 Mg  Tabs (Aspirin) .... Once Daily 5)  Alendronate Sodium 70 Mg  Tabs (Alendronate Sodium) .... Take 1 Tablet By Mouth Once A Week 6)  Zyrtec Allergy 10 Mg  Tabs (Cetirizine Hcl) .... Once Daily As Needed 7)  Albuterol 90 Mcg/act  Aers (Albuterol) .... 2 Puffs Q 4 As Needed 8)  Crestor 5 Mg Tabs (Rosuvastatin Calcium) .... 5x Weekly 9)  Nitroglycerin 0.4 Mg  Subl (Nitroglycerin) .Marland Kitchen.. 1 Sublingual As Needed 10)  Qvar 40 Mcg/act  Aers (Beclomethasone Dipropionate) .... Inhale 2 Puffs Once Daily 11)  Nexium 40 Mg Cpdr (Esomeprazole Magnesium) .Marland Kitchen.. 1 By Mouth Once Daily 12)  Omega 3 1000 Mg  Caps (Omega-3 Fatty Acids)  .... Take 3 Tablet By Mouth Once A Day 13)  Zantac 150 Mg Tabs (Ranitidine Hcl) .Marland Kitchen.. 1 Tab By Mouth Once Daily  Allergies (verified): 1)  ! Demerol 2)  ! Valium 3)  ! Augmentin 4)  ! Sulfamethoxazole-Trimethoprim (Sulfamethoxazole-Trimethoprim) 5)  ! Codeine   Vital Signs:  Patient profile:   77 year old female Height:      64 inches Weight:      146.75 pounds BMI:     25.28 Pulse rate:   92 / minute BP sitting:   122 / 80  Impression & Recommendations:  Problem # 1:  HYPERLIPIDEMIA (ICD-272.4) Pt's cholesterol is slightly elevated since last visit but likely due to the fact she has not taken her Crestor in 1 month.  Of biggest concern, LDL has increased to 150 ([id number]).  LFT are WNL. Pt is concerned over increasing Crestor because of her history of muscle pains.  Will plan to continue current medications for now and see if LDL will be closer to goal when she has taken the medication and resumed her normal diet and exercise routine.  Will f/u in 6 months.    Her updated medication list for this problem includes:  Crestor 5 Mg Tabs (Rosuvastatin calcium) .Marland Kitchen... 5x weekly  Patient Instructions: 1)  Restart Crestor 5mg  on Monday-Friday 2)  Continue all other medications 3)  Restart previous diet and exercise routine 4)  Lab Appt: May 7th, 2012 in AM at [place] office 5)  Lipid Clinic Appt: May 10th at 2:30 pm  Prescriptions: CRESTOR 5 MG TABS (ROSUVASTATIN CALCIUM) 5x weekly  #30 x 6   Entered by:   [name] PharmD   Authorized by:   Mariah Hamming, MD, [facility]   Signed by:   [name] PharmD on 06/28/2010   Method used:   Electronically to        [facility]* (retail)       [address]       [place], [place]  [id number]       Ph: [phone]       Fax: [phone]   RxID:   [id number]

## 2010-09-23 NOTE — Assessment & Plan Note (Signed)
Summary: new to lipid/hyperlipidemia   Referring Provider:  Olga Millers Primary Provider:  Norins  CC:  dyslipidemia follow-up.  History of Present Illness:  Lipid Clinic Visit      The patient comes in today for initial dyslipidemia consult.  The patient presents with medication problems including statin intolerance resulting in muscle aches and muscle cramps.  Dietary compliance review reveals an overall grade of A, eating 5 or more fruits and vegetables, and limiting fats and TFA's.  Review of exercise habits reveals that the patient is walking.  Adjunctive measures being instituted include omega-3 supplements.    [name] presents today for initial lipid consult.  She was followed by our Lipid clinic once before, but "fell off" the schedule at some point and is now being reinitiated into our clinic.  PMH includes placement of stent in 2005.  She is also a long standing smoker.  Lipid goals are TC <200, TG <150, LDL <100 with optional <70, HDL >50.    In the past,patient has tried vytorin and lipitor.  Both caused significant muscle aches and cramps.  Symptoms were not particularly bothersome during the day, but were worse at night, especially upon lying down for bed.  Pt also experienced muscle soreness to the point it was difficult to get out of a chair.  Most recently, she was prescribed Crestor 5 mg to be taken 3x/week.  Pt did not have symptoms on this dosing schedule.  About 4-6 wks ago the dose was increased to Crestor 5 mg to be taken 5x/week.  She has started noticing occassional symptoms similar to those experienced before on this dose, but has continued taking.  Patient is also taking Fish Oil 1000 mg three times a day.  Diet:  Overall Healthy, patient attempts to limit fats, and choose hi fiber options.   Breakfast:  Cereal, with almond breeze milk, and fruit Lunch:  Sandwiches on whole wheat Dinner:  Development worker, community at [facility] where she chooses baked options, often  fish, and gets plenty of vegetables (at least 3-4 servings/d). Avoids snacks Water is main beverage  Exercise:  Walks daily around [facility] (estimates 1 mile daily).  When she is away from home visiting family members she walks on treadmill daily.    Lipid Management Provider  Eda Keys, PharmD  Preventive Screening-Counseling & Management  Alcohol-Tobacco     Alcohol drinks/day: <1     Alcohol type: wine     Smoking Status: never  Current Medications (verified): 1)  Multivitamins   Caps (Multiple Vitamin) .... Once Daily 2)  Calcium 600 1500 Mg  Tabs (Calcium Carbonate) .... Take 1 Tablet By Mouth Once A Day 3)  Lisinopril 20 Mg Tabs (Lisinopril) .Marland Kitchen.. 1 Tab Daily 4)  Aspirin Low Dose 81 Mg  Tabs (Aspirin) .... Once Daily 5)  Alendronate Sodium 70 Mg  Tabs (Alendronate Sodium) .... Take 1 Tablet By Mouth Once A Week 6)  Zyrtec Allergy 10 Mg  Tabs (Cetirizine Hcl) .... Once Daily As Needed 7)  Albuterol 90 Mcg/act  Aers (Albuterol) .... 2 Puffs Q 4 As Needed 8)  Crestor 5 Mg Tabs (Rosuvastatin Calcium) .... 5x Weekly 9)  Nitroglycerin 0.4 Mg  Subl (Nitroglycerin) .Marland Kitchen.. 1 Sublingual As Needed 10)  Qvar 40 Mcg/act  Aers (Beclomethasone Dipropionate) .... As Needed 11)  Nexium 40 Mg Cpdr (Esomeprazole Magnesium) .Marland Kitchen.. 1 By Mouth Once Daily 12)  Omega 3 1000 Mg  Caps (Omega-3 Fatty Acids) .... Take 2 Tablet By Mouth Once  A Day 13)  Promethazine Hcl 12.5 Mg Tabs (Promethazine Hcl) .Marland Kitchen.. 1 By Mouth Q 6 As Needed Nausea. 14)  Omeprazole 40 Mg Cpdr (Omeprazole) .Marland Kitchen.. 1 Tab By Mouth Once Daily 15)  Zantac 150 Mg Tabs (Ranitidine Hcl) .Marland Kitchen.. 1 Tab By Mouth Once Daily 16)  Slo-Niacin 500 Mg Cr-Tabs (Niacin) .... Take 1 Tablet Each Evening, 30 Min After Taking Aspirin.  Allergies (verified): 1)  ! Demerol 2)  ! Valium 3)  ! Augmentin 4)  ! Sulfamethoxazole-Trimethoprim (Sulfamethoxazole-Trimethoprim) 5)  ! Codeine  Social History: Alcohol drinks/day:  <1   Vital Signs:  Patient  profile:   77 year old female Weight:      147 pounds BP sitting:   154 / 72  Impression & Recommendations:  Problem # 1:  HYPERLIPIDEMIA (ICD-272.4) Assessment Deteriorated Lipid Goals:  TC 230(goal<200), TG 87 (goal<150), HDL 50.5 (goal>50), LDL 169.4 (goal <100, optional<70).  Total chol is elevated above optimal.  TG at goal.  HDL is at goal, but has room for improvement.  LDL is worse vs. previous labs and currently above goal of <100.  Patient's diet is healthy and likely not a contributing factor since TGs are very well controlled.  Exercise is routine.  Primary issue is patient's intolerance to full dose statin therapy.  She is currently tolerating Crestor 5 mg daily 5x/week, although she is having some symptoms at this dose pt is willing to tolerate these for now.  Patient is a candidate for Niacin therapy to increase HDL and decrease LDL as well.  Patient is already taking Fish Oil.  Plan:   Continue healthy dietary choices. Continue regular exercise. Continue Crestor 5 mg 5x/wk Start Niacin (sloniacin) OTC 500 mg - take 1 tablet each evening 30 min after aspirin Follow-up with Clinic in 6-8 weeks.    Her updated medication list for this problem includes:    Crestor 5 Mg Tabs (Rosuvastatin calcium) .Marland Kitchen... 5x weekly    Slo-niacin 500 Mg Cr-tabs (Niacin) .Marland Kitchen... Take 1 tablet each evening, 30 min after taking aspirin.  Patient Instructions: 1)  It was a pleasure meeting with you today. 2)  Continue making healthy dietary choices, low-fat and high fiber options.   3)  Continue exercising as much as possible. 4)  Continue Crestor 5 mg - 5 times per week 5)  Start Niacin (SloNiacin) 500 mg - Take 1 tablet each night 30 minutes after taking aspirin. 6)  Return to clinic in 2 months. 7)  Fasting Labwork at [facility] on 02/15/10 @ 10 am 8)  Lipid Clinic on 02/18/10 @ 3:00 pm

## 2010-09-23 NOTE — Miscellaneous (Signed)
Summary: BONE DENSITY  Clinical Lists Changes  Orders: Added new Test order of T-Bone Densitometry (77080) - Signed Added new Test order of T-Lumbar Vertebral Assessment (77082) - Signed 

## 2010-09-23 NOTE — Assessment & Plan Note (Signed)
Summary: rov per pt call/lg    Visit Type:  Follow-up Referring Provider:  Olga Millers Primary Provider:  Norins   History of Present Illness: Pleasant female I have seen previously for coronary disease. She does have a history of drug-eluting stent to the circumflex in 2005. Her last Myoview in October 21 2009 was normal with no scar or ischemia and an ejection fraction of 82%. A  cardiac catheterization was performed on June 21, 2007. Her LV function was normal and she had minor plaquing in her coronaries but no obstructive disease was noted. Last carotid Dopplers were performed in November of 2009 and showed 0-39% bilateral stenosis. Followup was recommended in 2 years.  An echocardiogram in August of 2010 showed normal LV function and diastolic dysfunction. I last saw her in March of 2011. Since then she has well with no dyspnea, chest pain, palpitations or syncope. However she was recently on a cruise and developed an ear infection, vertigo and has not felt well since. She completed her last dose of antibiotics today and her steroids. She continues to feel fatigued. He is also having a nonproductive cough.  Current Medications (verified): 1)  Multivitamins   Caps (Multiple Vitamin) .... Once Daily 2)  Calcium 600 1500 Mg  Tabs (Calcium Carbonate) .... Take 1 Tablet By Mouth Once A Day 3)  Lisinopril 20 Mg Tabs (Lisinopril) .Marland Kitchen.. 1 Tab Daily 4)  Aspirin Low Dose 81 Mg  Tabs (Aspirin) .... Once Daily 5)  Alendronate Sodium 70 Mg  Tabs (Alendronate Sodium) .... Take 1 Tablet By Mouth Once A Week 6)  Zyrtec Allergy 10 Mg  Tabs (Cetirizine Hcl) .... Once Daily As Needed 7)  Albuterol 90 Mcg/act  Aers (Albuterol) .... 2 Puffs Q 4 As Needed 8)  Crestor 5 Mg Tabs (Rosuvastatin Calcium) .... 5x Weekly 9)  Nitroglycerin 0.4 Mg  Subl (Nitroglycerin) .Marland Kitchen.. 1 Sublingual As Needed 10)  Qvar 40 Mcg/act  Aers (Beclomethasone Dipropionate) .... Inhale 2 Puffs Once Daily 11)  Nexium 40 Mg Cpdr  (Esomeprazole Magnesium) .Marland Kitchen.. 1 By Mouth Once Daily 12)  Omega 3 1000 Mg  Caps (Omega-3 Fatty Acids) .... Take 3 Tablet By Mouth Once A Day 13)  Zantac 150 Mg Tabs (Ranitidine Hcl) .Marland Kitchen.. 1 Tab By Mouth Once Daily  Allergies: 1)  ! Demerol 2)  ! Valium 3)  ! Augmentin 4)  ! Sulfamethoxazole-Trimethoprim (Sulfamethoxazole-Trimethoprim) 5)  ! Codeine  Past History:  Past Medical History: Reviewed history from 05/15/2009 and no changes required. Breast cancer, hx of Peptic ulcer disease IBS DJD cervical spine RLL BOOP '05 hx of Diverticulitis Cystitis chronic rhinitis CAD s/p DES to Lcx 2005 Hyperlipidemia Hypertension Palpitations Depression  Past Surgical History: Reviewed history from 03/16/2007 and no changes required. right tramflam reconstruction-breast vein stripping ventral hernia repair '02 arthroplasty right thumb '02 Cholecystectomy Hysterectomy Mastectomy Oophorectomy  Social History: Reviewed history from 08/25/2009 and no changes required. widowed.  Has several children 3 dtrs, 2 sons and several grandchildren in  [place] and [address] She sees them often. Lives independently. Is very active.  Tobacco Use - No.   Review of Systems       Recent ear infection, vertigo and fatigue but no fevers or chills,  hemoptysis, dysphasia, odynophagia, melena, hematochezia, dysuria, hematuria, rash, seizure activity, orthopnea, PND, pedal edema, claudication. Remaining systems are negative.   Vital Signs:  Patient profile:   77 year old female Height:      64 inches Weight:  141.75 pounds BMI:     24.42 Pulse rate:   90 / minute Pulse rhythm:   regular Resp:     18 per minute BP sitting:   125 / 75  (left arm) Cuff size:   large  Vitals Entered By: Vikki Ports (July 20, 2010 10:55 AM)  Physical Exam  General:  Well-developed well-nourished in no acute distress.  Skin is warm and dry.  HEENT is normal.  Neck is supple. No thyromegaly.    Chest is clear to auscultation with normal expansion.  Cardiovascular exam is regular rate and rhythm.  Abdominal exam nontender or distended. No masses palpated. Extremities show no edema. neuro grossly intact    EKG  Procedure date:  07/20/2010  Findings:      Sinus rhythm at a rate of 90. Left anterior fascicular block. Cannot rule out prior septal infarct.  Impression & Recommendations:  Problem # 1:  CAROTID ARTERY STENOSIS (ICD-433.10) Continue aspirin and statin. Schedule followup carotid Dopplers. Her updated medication list for this problem includes:    Aspirin Low Dose 81 Mg Tabs (Aspirin) ..... Once daily  Her updated medication list for this problem includes:    Aspirin Low Dose 81 Mg Tabs (Aspirin) ..... Once daily  Problem # 2:  PALPITATIONS (ICD-785.1)  Not symptomatic at present. Her updated medication list for this problem includes:    Lisinopril 20 Mg Tabs (Lisinopril) .Marland Kitchen... 1 tab daily    Aspirin Low Dose 81 Mg Tabs (Aspirin) ..... Once daily    Nitroglycerin 0.4 Mg Subl (Nitroglycerin) .Marland Kitchen... 1 sublingual as needed  Orders: EKG w/ Interpretation (93000)  Her updated medication list for this problem includes:    Lisinopril 20 Mg Tabs (Lisinopril) .Marland Kitchen... 1 tab daily    Aspirin Low Dose 81 Mg Tabs (Aspirin) ..... Once daily    Nitroglycerin 0.4 Mg Subl (Nitroglycerin) .Marland Kitchen... 1 sublingual as needed  Problem # 3:  ESSENTIAL HYPERTENSION, BENIGN (ICD-401.1)  Blood pressure controlled on present medications. Will continue. Her updated medication list for this problem includes:    Lisinopril 20 Mg Tabs (Lisinopril) .Marland Kitchen... 1 tab daily    Aspirin Low Dose 81 Mg Tabs (Aspirin) ..... Once daily  Her updated medication list for this problem includes:    Lisinopril 20 Mg Tabs (Lisinopril) .Marland Kitchen... 1 tab daily    Aspirin Low Dose 81 Mg Tabs (Aspirin) ..... Once daily  Problem # 4:  CORONARY ATHEROSCLEROSIS NATIVE CORONARY ARTERY (ICD-414.01)  Continue aspirin,  ACE inhibitor and statin. Last Myoview normal. Her updated medication list for this problem includes:    Lisinopril 20 Mg Tabs (Lisinopril) .Marland Kitchen... 1 tab daily    Aspirin Low Dose 81 Mg Tabs (Aspirin) ..... Once daily    Nitroglycerin 0.4 Mg Subl (Nitroglycerin) .Marland Kitchen... 1 sublingual as needed  Her updated medication list for this problem includes:    Lisinopril 20 Mg Tabs (Lisinopril) .Marland Kitchen... 1 tab daily    Aspirin Low Dose 81 Mg Tabs (Aspirin) ..... Once daily    Nitroglycerin 0.4 Mg Subl (Nitroglycerin) .Marland Kitchen... 1 sublingual as needed  Problem # 5:  HYPERLIPIDEMIA (ICD-272.4)   Continue statin. Followed in the lipid clinic. Her updated medication list for this problem includes:    Crestor 5 Mg Tabs (Rosuvastatin calcium) .Marland Kitchen... 5x weekly  Her updated medication list for this problem includes:    Crestor 5 Mg Tabs (Rosuvastatin calcium) .Marland Kitchen... 5x weekly  Patient Instructions: 1)  Your physician recommends that you schedule a follow-up appointment in: 9 MONTHS WITH  DR CRENSHAW 2)  Your physician recommends that you continue on your current medications as directed. Please refer to the Current Medication list given to you today.

## 2010-09-23 NOTE — Progress Notes (Signed)
Summary: Nuclear Pre-Procedure  Phone Note Outgoing Call   Call placed by: Milana Na, EMT-P,  November 09, 2009 3:14 PM Summary of Call: Left message with information on Myoview Information Sheet (see scanned document for details).      Nuclear Med Background Indications for Stress Test: Evaluation for Ischemia, Stent Patency   History: Asthma, COPD, Echo, Heart Catheterization, Myocardial Perfusion Study, Stents  History Comments: '05 STENTS CFX 10/08 Heart Cath N/O CAD NL LVF patent stent CFX 10/08 MPS (-) scar/ischemia EF 79% 08/10 ECHO NL LVF EF 60% Diastolic Dysfunction  Symptoms: Chest Pain, Chest Pressure, DOE    Nuclear Pre-Procedure Cardiac Risk Factors: Carotid Disease, Family History - CAD, Hypertension, Lipids, Smoker Height (in): 64  Nuclear Med Study Referring MD:  B.Crenshaw

## 2010-09-23 NOTE — Miscellaneous (Signed)
Summary: Orders Update  Clinical Lists Changes  Orders: Added new Referral order of Cardiology Referral (Cardiology) - Signed 

## 2010-09-23 NOTE — Progress Notes (Signed)
Summary: Pharm ?  Phone Note From Pharmacy   Caller: [facility] [place] [phone] Summary of Call: Per pharmacy, to received escript for amoxicillin two times a day x 7 days #21. They would like to calrify the quanity on that and to know if its two times a day or three times a day .Marland KitchenMarland KitchenAlvy Beal Archie CMA  July 12, 2010 11:47 AM   Follow-up for Phone Call        miscounted: should be #14, two times a day x 7 Follow-up by: Jacques Navy MD,  July 12, 2010 5:26 PM    Prescriptions: AMOXICILLIN 875 MG TABS (AMOXICILLIN) 1 by mouth two times a day x 7 days for otitis media  #14 x 0   Entered by:   Lamar Sprinkles, CMA   Authorized by:   Jacques Navy MD   Signed by:   Lamar Sprinkles, CMA on 07/12/2010   Method used:   Electronically to        [facility]* (retail)       [address]       [place], [place]  [id number]       Ph: [phone]       Fax: [phone]   RxID:   [id number]

## 2010-09-23 NOTE — Assessment & Plan Note (Signed)
Summary: WEAK--STC   Vital Signs:  Patient profile:   77 year old female Height:      64 inches Weight:      143 pounds BMI:     24.63 O2 Sat:      96 % on Room air Temp:     98.5 degrees F oral Pulse rate:   93 / minute BP sitting:   118 / 84 Cuff size:   regular  Vitals Entered By: Bill Salinas CMA (November 16, 2009 1:07 PM)  O2 Flow:  Room air CC: pt c/o weakness, fatigue, nausea, loss of appetite and dizziness x 3 weeks/ ab   Primary Care Provider:  [facility]  CC:  pt c/o weakness, fatigue, nausea, and loss of appetite and dizziness x 3 weeks/ ab.  History of Present Illness: Presents for generalized weakness and loss of energy. She feels to fatigued to go to the beach. She did have a bout of gastroenteritis March 5-6 but stayed at home and got better. She saw Dr. Jens Som on the 15th and had an exercise stress test that was normal but her labs were abnormal.  Reviewed chart and all meds for potential side-effects, including labs.   She has been seeing Dr. Dellia Cloud about the problems with her daughters and the issue over real estate. She has read "Walking on Eggshells" which has really helped.   Patient does report that she had three nights in a row of vivid hallucination of a person standing by her bed. She denies having any fever or acute illness at the time. She has had no alteration in cognition, no daytime hallucinations. She does admit to poor memory but no other deficits.  Current Medications (verified): 1)  Multivitamins   Caps (Multiple Vitamin) .... Once Daily 2)  Calcium 600 1500 Mg  Tabs (Calcium Carbonate) .... Take 1 Tablet By Mouth Once A Day 3)  Lisinopril 20 Mg Tabs (Lisinopril) .Marland Kitchen.. 1 Tab Daily 4)  Aspirin Low Dose 81 Mg  Tabs (Aspirin) .... Once Daily 5)  Alendronate Sodium 70 Mg  Tabs (Alendronate Sodium) .... Take 1 Tablet By Mouth Once A Week 6)  Zyrtec Allergy 10 Mg  Tabs (Cetirizine Hcl) .... Once Daily As Needed 7)  Albuterol 90 Mcg/act  Aers  (Albuterol) .... 2 Puffs Q 4 As Needed 8)  Crestor 5 Mg Tabs (Rosuvastatin Calcium) .... 3x Weekly 9)  Nitroglycerin 0.4 Mg  Subl (Nitroglycerin) .Marland Kitchen.. 1 Sublingual As Needed 10)  Qvar 40 Mcg/act  Aers (Beclomethasone Dipropionate) .... As Needed 11)  Nexium 40 Mg Cpdr (Esomeprazole Magnesium) .Marland Kitchen.. 1 By Mouth Once Daily 12)  Omega 3 1000 Mg  Caps (Omega-3 Fatty Acids) .... Take 2 Tablet By Mouth Once A Day 13)  Promethazine Hcl 12.5 Mg Tabs (Promethazine Hcl) .Marland Kitchen.. 1 By Mouth Q 6 As Needed Nausea. 14)  Omeprazole 40 Mg Cpdr (Omeprazole) .Marland Kitchen.. 1 Tab By Mouth Once Daily 15)  Zantac 150 Mg Tabs (Ranitidine Hcl) .Marland Kitchen.. 1 Tab By Mouth Once Daily  Allergies (verified): 1)  ! Demerol 2)  ! Valium 3)  ! Augmentin 4)  ! Sulfamethoxazole-Trimethoprim (Sulfamethoxazole-Trimethoprim) 5)  ! Codeine  Past History:  Past Medical History: Last updated: 05/15/2009 Breast cancer, hx of Peptic ulcer disease IBS DJD cervical spine RLL BOOP '05 hx of Diverticulitis Cystitis chronic rhinitis CAD s/p DES to Lcx 2005 Hyperlipidemia Hypertension Palpitations Depression  Past Surgical History: Last updated: 03/16/2007 right tramflam reconstruction-breast vein stripping ventral hernia repair '02 arthroplasty right thumb '02 Cholecystectomy Hysterectomy  Mastectomy Oophorectomy  Family History: Last updated: 10/28/2008 Asthma Breast Cancer COPD:  father died of Lung Cancer Mother died at 38 of heart problems  Social History: Last updated: 08/25/2009 widowed.  Has several children 3 dtrs, 2 sons and several grandchildren in  [place] and [address] She sees them often. Lives independently. Is very active.  Tobacco Use - No.   Risk Factors: Smoking Status: never (05/15/2009) Passive Smoke Exposure: yes (09/17/2008)  Review of Systems  The patient denies anorexia, fever, weight loss, weight gain, decreased hearing, chest pain, dyspnea on exertion, prolonged cough, hemoptysis, abdominal  pain, muscle weakness, transient blindness, difficulty walking, and abnormal bleeding.    Physical Exam  General:  WNWD white female in no distress Head:  normocephalic, atraumatic, and no abnormalities observed.   Eyes:  vision grossly intact, pupils equal, pupils round, corneas and lenses clear, and no injection.   Neck:  supple, full ROM, no masses, no thyromegaly, and no carotid bruits.   Chest Wall:  No deformities, masses, or tenderness noted. Lungs:  Normal respiratory effort, chest expands symmetrically. Lungs are clear to auscultation, no crackles or wheezes. Heart:  Normal rate and regular rhythm. S1 and S2 normal without gallop, murmur, click, rub or other extra sounds. Abdomen:  soft, non-tender, normal bowel sounds, and no hepatomegaly.   Msk:  normal ROM, no joint tenderness, no joint swelling, no redness over joints, and no joint deformities.   Pulses:  2+ radial pulses Neurologic:  alert & oriented X3, cranial nerves II-XII intact, strength normal in all extremities, and gait normal.   Skin:  turgor normal, color normal, no suspicious lesions, and no ulcerations.   Cervical Nodes:  no anterior cervical adenopathy and no posterior cervical adenopathy.   Psych:  Oriented X3, normally interactive, good eye contact, and not anxious appearing.     Impression & Recommendations:  Problem # 1:  OTHER MALAISE AND FATIGUE (ICD-780.79) Per the HPI. Exam is unrevealing - no lymphadenopathy or signs infection; no weight loss or signs of malignancy. She is current with routine health maintenance. Need to r/o underlying metabolic abnormality. If all negative need to consider sleep habit and emotional state - which is admittedly better after several sessions with Dr. Dellia Cloud.  Plan - lab to include B12, Thyroid, blood counts, iron levels.  Orders: TLB-B12 + Folate Pnl (04540_98119-J47/WGN) TLB-IBC Pnl (Iron/FE;Transferrin) (83550-IBC) TLB-CBC Platelet - w/Differential  (85025-CBCD) TLB-TSH (Thyroid Stimulating Hormone) (84443-TSH)  Addendum - all labs results are normal except for increased percentage of monocytes on CBC of minimal pathologic significance.  Plan - no additional testing           Healthy diet: focus on protein; regular exercise. Good sleep habit - 6-8 hours per night           For unremitting symptoms will consider a trial of  Welbutrin XL  Problem # 2:  CORONARY ATHEROSCLEROSIS NATIVE CORONARY ARTERY (ICD-414.01)  Her updated medication list for this problem includes:    Lisinopril 20 Mg Tabs (Lisinopril) .Marland Kitchen... 1 tab daily    Aspirin Low Dose 81 Mg Tabs (Aspirin) ..... Once daily    Nitroglycerin 0.4 Mg Subl (Nitroglycerin) .Marland Kitchen... 1 sublingual as needed  Labs Reviewed: Chol: 230 (11/10/2009)   HDL: 50.50 (11/10/2009)   LDL: 169,4  (10/30/2008)   TG: 87.0 (11/10/2009)  Definitely not at goal of an LDL of 100 or less.  Plan - increase Crestor 5mg  to 5 days a week. Repeat lab in 1 month. If not  at goal will refer to Lipid clinic  Problem # 3:  HALLUCINATIONS (ICD-780.1) Patient reports three nighs of vivid hallucination of a repetitive type. This has not recurred. Her exam today is negative for any neurologic findings or cognitive impairment. No indication for neuro imaging.  Plan - for recurrent hallucinations will obtain MRI brain, consider neuro consult.  Complete Medication List: 1)  Multivitamins Caps (Multiple vitamin) .... Once daily 2)  Calcium 600 1500 Mg Tabs (Calcium carbonate) .... Take 1 tablet by mouth once a day 3)  Lisinopril 20 Mg Tabs (Lisinopril) .Marland Kitchen.. 1 tab daily 4)  Aspirin Low Dose 81 Mg Tabs (Aspirin) .... Once daily 5)  Alendronate Sodium 70 Mg Tabs (Alendronate sodium) .... Take 1 tablet by mouth once a week 6)  Zyrtec Allergy 10 Mg Tabs (Cetirizine hcl) .... Once daily as needed 7)  Albuterol 90 Mcg/act Aers (Albuterol) .... 2 puffs q 4 as needed 8)  Crestor 5 Mg Tabs (Rosuvastatin calcium) .... 3x  weekly 9)  Nitroglycerin 0.4 Mg Subl (Nitroglycerin) .Marland Kitchen.. 1 sublingual as needed 10)  Qvar 40 Mcg/act Aers (Beclomethasone dipropionate) .... As needed 11)  Nexium 40 Mg Cpdr (Esomeprazole magnesium) .Marland Kitchen.. 1 by mouth once daily 12)  Omega 3 1000 Mg Caps (Omega-3 fatty acids) .... Take 2 tablet by mouth once a day 13)  Promethazine Hcl 12.5 Mg Tabs (Promethazine hcl) .Marland Kitchen.. 1 by mouth q 6 as needed nausea. 14)  Omeprazole 40 Mg Cpdr (Omeprazole) .Marland Kitchen.. 1 tab by mouth once daily 15)  Zantac 150 Mg Tabs (Ranitidine hcl) .Marland Kitchen.. 1 tab by mouth once daily  Patient: [name] Note: All result statuses are Final unless otherwise noted.  Tests: (1) B12 + Folate Panel (B12/FOL)   Vitamin B12          [H]  1184 pg/mL                  211-911   Folate                    >20.0 ng/mL     Deficient  0.4 - 3.4 ng/mL     Indeterminate  3.4 - 5.4 ng/mL     Normal  >5.4 ng/mL  Tests: (2) IBC Panel (IBC)   Iron                      101 ug/dL                   16-109   Transferrin          [L]  199.9 mg/dL                 604.5-409.8   Iron Saturation           36.1 %                      20.0-50.0  Tests: (3) CBC Platelet w/Diff (CBCD)   White Cell Count          4.6 K/uL                    4.5-10.5   Red Cell Count            4.32 Mil/uL                 3.87-5.11   Hemoglobin  13.5 g/dL                   04.5-40.9   Hematocrit                41.2 %                      36.0-46.0   MCV                       95.4 fl                     78.0-100.0   MCHC                      32.6 g/dL                   81.1-91.4   RDW                       13.1 %                      11.5-14.6   Platelet Count            295.0 K/uL                  150.0-400.0   Neutrophil %         [L]  39.9 %                      43.0-77.0   Lymphocyte %              44.4 %                      12.0-46.0   Monocyte %           [H]  13.5 %                      3.0-12.0   Eosinophils%              1.7 %                        0.0-5.0   Basophils %               0.5 %                       0.0-3.0   Neutrophill Absolute      1.8 K/uL                    1.4-7.7   Lymphocyte Absolute       2.1 K/uL                    0.7-4.0   Monocyte Absolute         0.6 K/uL                    0.1-1.0  Eosinophils, Absolute                             0.1 K/uL                    0.0-0.7   Basophils Absolute  0.0 K/uL                    0.0-0.1  Tests: (4) TSH (TSH)   FastTSH                   3.50 uIU/mL                 0.35-5.50

## 2010-09-27 ENCOUNTER — Other Ambulatory Visit: Payer: Self-pay | Admitting: Internal Medicine

## 2010-09-27 ENCOUNTER — Encounter (INDEPENDENT_AMBULATORY_CARE_PROVIDER_SITE_OTHER): Payer: Self-pay | Admitting: *Deleted

## 2010-09-27 ENCOUNTER — Other Ambulatory Visit: Payer: Medicare Other

## 2010-09-27 ENCOUNTER — Encounter: Payer: Self-pay | Admitting: Internal Medicine

## 2010-09-27 ENCOUNTER — Ambulatory Visit (INDEPENDENT_AMBULATORY_CARE_PROVIDER_SITE_OTHER): Payer: Medicare Other | Admitting: Internal Medicine

## 2010-09-27 ENCOUNTER — Ambulatory Visit (INDEPENDENT_AMBULATORY_CARE_PROVIDER_SITE_OTHER)
Admission: RE | Admit: 2010-09-27 | Discharge: 2010-09-27 | Disposition: A | Discharge status: Home / Self Care | Payer: Medicare Other | Source: Ambulatory Visit | Attending: Internal Medicine | Admitting: Internal Medicine

## 2010-09-27 DIAGNOSIS — K5792 Diverticulitis of intestine, part unspecified, without perforation or abscess without bleeding: Secondary | ICD-10-CM

## 2010-09-27 DIAGNOSIS — R197 Diarrhea, unspecified: Secondary | ICD-10-CM

## 2010-09-27 DIAGNOSIS — K5732 Diverticulitis of large intestine without perforation or abscess without bleeding: Secondary | ICD-10-CM

## 2010-09-27 LAB — CBC WITH DIFFERENTIAL/PLATELET
Eosinophils Relative: 2.6 % (ref 0.0–5.0)
HCT: 38.7 % (ref 36.0–46.0)
Hemoglobin: 13.2 g/dL (ref 12.0–15.0)
Lymphs Abs: 1.7 10*3/uL (ref 0.7–4.0)
MCV: 93.3 fl (ref 78.0–100.0)
Monocytes Absolute: 0.9 10*3/uL (ref 0.1–1.0)
Monocytes Relative: 13.2 % — ABNORMAL HIGH (ref 3.0–12.0)
Neutro Abs: 3.9 10*3/uL (ref 1.4–7.7)
Platelets: 395 10*3/uL (ref 150.0–400.0)
RDW: 14.3 % (ref 11.5–14.6)
WBC: 6.7 10*3/uL (ref 4.5–10.5)

## 2010-09-27 LAB — BASIC METABOLIC PANEL
BUN: 15 mg/dL (ref 6–23)
Chloride: 104 mEq/L (ref 96–112)
GFR: 58.19 mL/min — ABNORMAL LOW (ref >60.00)
Glucose, Bld: 111 mg/dL — ABNORMAL HIGH (ref 70–99)
Potassium: 4.3 mEq/L (ref 3.5–5.1)
Sodium: 140 mEq/L (ref 135–145)

## 2010-09-27 MED ORDER — IOHEXOL 300 MG/ML  SOLN
100.0000 mL | Freq: Once | INTRAMUSCULAR | Status: AC | PRN
  Administered 2010-09-27: 100 mL via INTRAVENOUS

## 2010-09-28 NOTE — Discharge Summary (Signed)
[name], [name]            ACCOUNT NO.:  [id number]  MEDICAL RECORD NO.:  [id number]          PATIENT TYPE:  INP  LOCATION:  1414                         FACILITY:  [facility]  PHYSICIAN:  Rosalyn Gess. Kyrstyn Greear, MD  DATE OF BIRTH:  01-Nov-1929  DATE OF ADMISSION:  09/19/2010 DATE OF DISCHARGE:  09/22/2010                              DISCHARGE SUMMARY   ADMITTING DIAGNOSES: 1. Diverticulitis. 2. Chest pain.  DISCHARGE DIAGNOSES: 1. Diverticulitis, stable. 2. Chest pain, myocardial infarction ruled out.  CONSULTANTS:  None.  PROCEDURES: 1. Chest x-ray in the emergency department at admission with no active     cardiopulmonary disease except for bibasilar atelectasis. 2. CT scan of the abdomen and pelvis with contrast read out showing     acute diverticulitis at the junction of the distal descending and     proximal sigmoid colon.  No complicating features noted.  Multiple     rectosigmoid and distal descending colonic diverticula noted.     Small hiatal hernia.  HISTORY OF PRESENT ILLNESS:  [name] is a delightful 77 year old with multiple medical problems who has been medically stable who does have a prior history of diverticulitis.  The patient presented to the office with left lower quadrant abdominal pain and was diagnosed with diverticulitis and started on oral medications with Cipro and Flagyl. [facility].  The patient however was unable to keep down food and fluids because of increasing nausea, therefore presented to the emergency department.  She had some chest pain and tightness but she was attributed this to her abdominal pain.  She does have a history of coronary artery disease.  The patient also reported she had been febrile with fever to 100.  Because of her diverticulitis confirmed by CT scan and inability to keep down oral medications, she was admitted to hospital.  Please see the H and P as well as past EMR  records for past medical history, family history and social history.  ADMISSION MEDICATIONS:  Medications on admission included vitamins, calcium, lisinopril 20 mg daily, aspirin 81 mg daily, alendronate 70 mg weekly, Zyrtec as needed, albuterol as needed, Crestor 5 mg 5 times a week, sublingual nitroglycerin p.r.n., Qvar 40 mcg 2 puffs daily, Nexium 40 mg daily, omega-3 fatty acids 3000 mg daily, Zantac 150 mg q.p.m. The patient had been started on Cipro 500 b.i.d. and Flagyl 500 t.i.d. on September 18, 2010.  HOSPITAL COURSE: 1. Diverticulitis.  The patient did have diverticulitis confirmed by     CT scan.  The patient was continued on p.o. Flagyl but Cipro was     given IV.  On this regimen, the patient remained afebrile.  Her     nausea and vomiting did improve, so she was able to keep down food,     fluids, and medications.  She did continue to have some tenderness     in the left lower quadrant.  With the patient being able take     advance diet with nausea basically resolved, her being afebrile, at     this time she is stable and ready  for discharge home and will     complete antibiotic course as an outpatient. 2. Cardiovascular.  The patient had cardiac enzymes that were cycled     x5 and were negative.  She had no abnormalities on telemetry.     Electrocardiogram showed no acute changes.  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  With the patient being medically stable, taking p.o. foods, fluids, and medications, will not discharge her to home.  DISCHARGE EXAMINATION:  VITAL SIGNS:  Temperature was 98.2, blood pressure 111/66, pulse was 80, respirations 18, O2 sat 97% on room air. GENERAL APPEARANCE:  A well-nourished woman, in no acute distress. CHEST:  The patient is moving air well with no wheezes, no rales, no increased work of breathing. CARDIOVASCULAR:  The patient had a quiet precordium with regular rate and rhythm. ABDOMEN:  The patient had positive bowel  sounds.  Abdomen was soft.  She did have tenderness to palpation in the left lower quadrant but no true guarding or rebound.  No further examination conducted.  LABORATORY DATA:  From September 22, 2010, day of discharge, CBC with white count of 5100, hemoglobin of 11.4 g, hematocrit 33.9%, platelet count 261,000.  Metabolic panel with sodium 141, potassium 3.5, chloride 108, CO2 of 25, BUN of 5, creatinine 0.93, glucose 106.  No additional radiographic studies.  DISPOSITION:  The patient is discharged to home.  She will be seen in followup on Monday, September 27, 2010.  She will continue on Cipro and Flagyl and use the home supply she was provided at the time of her outpatient clinic visit on September 18, 2010.  CONDITION ON DISCHARGE:  The patient's condition at time of discharge dictation is stable and improved with persistent abdominal pain and need to complete antibiotic course.     Rosalyn Gess Ilse Billman, MD     MEN/MEDQ  D:  09/22/2010  T:  09/22/2010  Job:  [id number]  Electronically Signed by Illene Regulus MD on 09/28/2010 07:30:46 AM

## 2010-09-29 NOTE — Progress Notes (Signed)
Summary: Nausea  Phone Note Call from Patient Call back at [phone]   Summary of Call: Patient is requesting rx to help w/nausea.  Initial call taken by: Lamar Sprinkles, CMA,  September 23, 2010 2:28 PM  Follow-up for Phone Call        promethazine 12.5 mg 1 by mouth q 6 as needed nausea. 15, 1 refill Follow-up by: Jacques Navy MD,  September 23, 2010 5:59 PM  Additional Follow-up for Phone Call Additional follow up Details #1::        Pt informed  Additional Follow-up by: Lamar Sprinkles, CMA,  September 23, 2010 6:01 PM    New/Updated Medications: PROMETHAZINE HCL 12.5 MG TABS (PROMETHAZINE HCL) 1 by mouth q 6 as needed nausea Prescriptions: PROMETHAZINE HCL 12.5 MG TABS (PROMETHAZINE HCL) 1 by mouth q 6 as needed nausea  #15 x 1   Entered and Authorized by:   Jacques Navy MD   Signed by:   Jacques Navy MD on 09/23/2010   Method used:   Electronically to        [facility]* (retail)       [address]       [place], [place]  [id number]       Ph: [phone]       Fax: [phone]   RxID:   [id number]

## 2010-10-03 ENCOUNTER — Telehealth: Payer: Self-pay | Admitting: Internal Medicine

## 2010-10-07 NOTE — Assessment & Plan Note (Signed)
Summary: POST HOSP/NWS   Vital Signs:  Patient profile:   77 year old female Height:      64 inches Weight:      141 pounds BMI:     24.29 O2 Sat:      99 % on Room air Temp:     97.8 degrees F oral Pulse rate:   91 / minute BP sitting:   106 / 62  (left arm) Cuff size:   regular  Vitals Entered By: Bill Salinas CMA (September 27, 2010 10:38 AM)  O2 Flow:  Room air CC: pt here for post hosp follow up/ ab   Primary Care Provider:  Isamu Trammel  CC:  pt here for post hosp follow up/ ab.  History of Present Illness: Pat[facility] recently hospitalized with diverticulitis and severe nausea. She was in -patinet for 4 days. At time of discharge she was still a little tender but able to keep down meds. She had continue on cipro and flagyl. She reports that since d/c she has had low grade temperature to 100 F. She has had frequent loose stools without blood or mucus. She has had continue abdominal pain.   Current Medications (verified): 1)  Multivitamins   Caps (Multiple Vitamin) .... Once Daily 2)  Calcium 600 1500 Mg  Tabs (Calcium Carbonate) .... Take 1 Tablet By Mouth Once A Day 3)  Lisinopril 20 Mg Tabs (Lisinopril) .Marland Kitchen.. 1 Tab Daily 4)  Aspirin Low Dose 81 Mg  Tabs (Aspirin) .... Once Daily 5)  Alendronate Sodium 70 Mg  Tabs (Alendronate Sodium) .... Take 1 Tablet By Mouth Once A Week 6)  Zyrtec Allergy 10 Mg  Tabs (Cetirizine Hcl) .... Once Daily As Needed 7)  Albuterol 90 Mcg/act  Aers (Albuterol) .... 2 Puffs Q 4 As Needed 8)  Crestor 5 Mg Tabs (Rosuvastatin Calcium) .... 5x Weekly 9)  Nitroglycerin 0.4 Mg  Subl (Nitroglycerin) .Marland Kitchen.. 1 Sublingual As Needed 10)  Qvar 40 Mcg/act  Aers (Beclomethasone Dipropionate) .... Inhale 2 Puffs Once Daily 11)  Nexium 40 Mg Cpdr (Esomeprazole Magnesium) .Marland Kitchen.. 1 By Mouth Once Daily 12)  Omega 3 1000 Mg  Caps (Omega-3 Fatty Acids) .... Take 3 Tablet By Mouth Once A Day 13)  Zantac 150 Mg Tabs (Ranitidine Hcl) .Marland Kitchen.. 1 Tab By Mouth Once Daily 14)  Cipro 500  Mg Tabs (Ciprofloxacin Hcl) .Marland Kitchen.. 1 Tab By Mouth Two Times A Day X 10 Days 15)  Flagyl 500 Mg Tabs (Metronidazole) .Marland Kitchen.. 1 Tab By Mouth Three Times A Day X 10 Days 16)  Promethazine Hcl 12.5 Mg Tabs (Promethazine Hcl) .Marland Kitchen.. 1 By Mouth Q 6 As Needed Nausea  Allergies (verified): 1)  ! Demerol 2)  ! Valium 3)  ! Augmentin 4)  ! Sulfamethoxazole-Trimethoprim (Sulfamethoxazole-Trimethoprim) 5)  ! Codeine  Past History:  Past Medical History: Last updated: 05/15/2009 Breast cancer, hx of Peptic ulcer disease IBS DJD cervical spine RLL BOOP '05 hx of Diverticulitis Cystitis chronic rhinitis CAD s/p DES to Lcx 2005 Hyperlipidemia Hypertension Palpitations Depression  Past Surgical History: Last updated: 03/16/2007 right tramflam reconstruction-breast vein stripping ventral hernia repair '02 arthroplasty right thumb '02 Cholecystectomy Hysterectomy Mastectomy Oophorectomy  Family History: Last updated: 10/28/2008 Asthma Breast Cancer COPD:  father died of Lung Cancer Mother died at 44 of heart problems  Social History: Last updated: 08/25/2009 widowed.  Has several children 3 dtrs, 2 sons and several grandchildren in  [place] and [address] She sees them often. Lives independently. Is very active.  Tobacco  Use - No.   Review of Systems       The patient complains of fever, weight loss, chest pain, and abdominal pain.  The patient denies anorexia, decreased hearing, hoarseness, syncope, dyspnea on exertion, peripheral edema, prolonged cough, headaches, melena, hematochezia, severe indigestion/heartburn, muscle weakness, difficulty walking, depression, and enlarged lymph nodes.    Physical Exam  General:  Well-developed,well-nourished,in no acute distress; alert,appropriate and cooperative throughout examination Head:  normocephalic and atraumatic.   Eyes:  pupils equal, pupils round, and corneas and lenses clear.   Lungs:  normal respiratory effort.   Heart:   normal rate and regular rhythm.   Abdomen:  hypoactive BS. Very tender to palpation in the LLQ. Not true garding. Msk:  normal ROM, no joint tenderness, no joint swelling, no joint warmth, and no joint instability.   Pulses:  2+ radial  Neurologic:  alert & oriented X3, cranial nerves II-XII intact, gait normal, and DTRs symmetrical and normal.   Skin:  turgor normal, color normal, and no rashes.   Psych:  Oriented X3, normally interactive, and good eye contact.     Impression & Recommendations:  Problem # 1:  DIARRHEA OF PRESUMED INFECTIOUS ORIGIN (ICD-009.3) Patient with diarrhea after hospitalization and tratment with flagyl and cipro. Could be C.Diff despoite being on flagyl  Plan - stool for C.Diff Orders: T-Culture, C-Diff Toxin A/B ([id number])  Problem # 2:  DIVERTICULITIS, ACUTE (ICD-562.11) Patinet with continuesLLQ abdominal pain and diarrhea and low grade fever.Concern for continue infection and possibly a diverticular abscess.  Plan - lab: CBC, BMP           CT abd/pelvis with contrast.  Orders: TLB-BMP (Basic Metabolic Panel-BMET) (80048-METABOL) TLB-CBC Platelet - w/Differential (85025-CBCD) Radiology Referral (Radiology)  Addendum - CBC, Bmet - normal. CT - persistent diverticulitis.  Plan - change to augmentin 875 two times a day. discussed "allergy" which was GI distress in the past. She will try to take this with food on her stomach and she has phenergan and Nexium on board.   Complete Medication List: 1)  Multivitamins Caps (Multiple vitamin) .... Once daily 2)  Calcium 600 1500 Mg Tabs (Calcium carbonate) .... Take 1 tablet by mouth once a day 3)  Lisinopril 20 Mg Tabs (Lisinopril) .Marland Kitchen.. 1 tab daily 4)  Aspirin Low Dose 81 Mg Tabs (Aspirin) .... Once daily 5)  Alendronate Sodium 70 Mg Tabs (Alendronate sodium) .... Take 1 tablet by mouth once a week 6)  Zyrtec Allergy 10 Mg Tabs (Cetirizine hcl) .... Once daily as needed 7)  Albuterol 90 Mcg/act Aers  (Albuterol) .... 2 puffs q 4 as needed 8)  Crestor 5 Mg Tabs (Rosuvastatin calcium) .... 5x weekly 9)  Nitroglycerin 0.4 Mg Subl (Nitroglycerin) .Marland Kitchen.. 1 sublingual as needed 10)  Qvar 40 Mcg/act Aers (Beclomethasone dipropionate) .... Inhale 2 puffs once daily 11)  Nexium 40 Mg Cpdr (Esomeprazole magnesium) .Marland Kitchen.. 1 by mouth once daily 12)  Omega 3 1000 Mg Caps (Omega-3 fatty acids) .... Take 3 tablet by mouth once a day 13)  Zantac 150 Mg Tabs (Ranitidine hcl) .Marland Kitchen.. 1 tab by mouth once daily 14)  Cipro 500 Mg Tabs (Ciprofloxacin hcl) .Marland Kitchen.. 1 tab by mouth two times a day x 10 days 15)  Flagyl 500 Mg Tabs (Metronidazole) .Marland Kitchen.. 1 tab by mouth three times a day x 10 days 16)  Promethazine Hcl 12.5 Mg Tabs (Promethazine hcl) .Marland Kitchen.. 1 by mouth q 6 as needed nausea 17)  Amoxicillin-pot Clavulanate 875-125 Mg Tabs (Amoxicillin-pot  clavulanate) .Marland Kitchen.. 1 by mouth two times a day x 10 days for diverticulitis Prescriptions: AMOXICILLIN-POT CLAVULANATE 875-125 MG TABS (AMOXICILLIN-POT CLAVULANATE) 1 by mouth two times a day x 10 days for diverticulitis  #20 x 0   Entered and Authorized by:   Jacques Navy MD   Signed by:   Jacques Navy MD on 09/27/2010   Method used:   Electronically to        [facility]* (retail)       [address]       [place], [place]  [id number]       Ph: [phone]       Fax: [phone]   RxID:   [id number]    Orders Added: 1)  TLB-BMP (Basic Metabolic Panel-BMET) [80048-METABOL] 2)  TLB-CBC Platelet - w/Differential [85025-CBCD] 3)  T-Culture, C-Diff Toxin A/B [[id number]] 4)  Radiology Referral [Radiology] 5)  Est. Patient Level IV [36644]

## 2010-10-13 NOTE — Progress Notes (Signed)
Summary: UPDATE  Phone Note Outgoing Call   Reason for Call: Get patient information Summary of Call: Did she tolerate the augmentin?  Is she feeling better?  Thanks Initial call taken by: Jacques Navy MD,  October 03, 2010 6:08 PM  Follow-up for Phone Call        Called pt with md response. Pt states was able to take augmentin. Nausea is alot better with med that was rx. Diarrhea is finally slowing down. Stomach is still sore, but not painful. Pt want to ask md when can she eat solid foods, and when can she travel. Any other recommendations would be helpful. Pls advise Follow-up by: Orlan Leavens RMA,  October 06, 2010 11:37 AM  Additional Follow-up for Phone Call Additional follow up Details #1::        may take solid food if she feels she can keep it down. If no fever and abdominal pain is better she can travel.  Additional Follow-up by: Jacques Navy MD,  October 06, 2010 12:43 PM    Additional Follow-up for Phone Call Additional follow up Details #2::    Pt informed  Follow-up by: Lamar Sprinkles, CMA,  October 06, 2010 6:46 PM

## 2010-10-23 ENCOUNTER — Ambulatory Visit (INDEPENDENT_AMBULATORY_CARE_PROVIDER_SITE_OTHER): Payer: Medicare Other | Admitting: Family Medicine

## 2010-10-23 ENCOUNTER — Encounter: Payer: Self-pay | Admitting: Family Medicine

## 2010-10-23 DIAGNOSIS — L509 Urticaria, unspecified: Secondary | ICD-10-CM | POA: Insufficient documentation

## 2010-10-28 NOTE — Assessment & Plan Note (Signed)
Summary: Urticaria   Vital Signs:  Patient profile:   77 year old female Height:      64 inches Weight:      141 pounds BMI:     24.29 O2 Sat:      96 % on Room air Temp:     98.5 degrees F oral Pulse rate:   99 / minute BP sitting:   136 / 78  (left arm) Cuff size:   large  Vitals Entered By: Payton Spark CMA (October 23, 2010 11:42 AM)  O2 Flow:  Room air CC: Rash on legs x 1 day   Primary Care Provider:  Norins  CC:  Rash on legs x 1 day.  History of Present Illness: Noticed rash yesterday. It is itchy. took a benadrl.  Never had this before.  No new meds.  no new soaps, lotions, detergents.  No SOB or wheezing. No rash on her fact. It is on her arms, chest and leg. completed ABX about 2 weeks ago.  No food allergies.   Allergies: 1)  ! Demerol 2)  ! Valium 3)  ! Augmentin 4)  ! Sulfamethoxazole-Trimethoprim (Sulfamethoxazole-Trimethoprim) 5)  ! Codeine  Physical Exam  General:  Well-developed,well-nourished,in no acute distress; alert,appropriate and cooperative throughout examination Head:  Normocephalic and atraumatic without obvious abnormalities. No apparent alopecia or balding. Lungs:  Normal respiratory effort, chest expands symmetrically. Lungs are clear to auscultation, no crackles or wheezes. Heart:  Normal rate and regular rhythm. S1 and S2 normal without gallop, murmur, click, rub or other extra sounds. Skin:  Erythematous macules and papules 0.5 to 2 cm scattered on her legs and chest and upper arm.    Impression & Recommendations:  Problem # 1:  URTICARIA (ICD-708.9) Call if gets worse or gets SOB. Will treat with antihistamin and steroids If returns then f/u. No new factors or meds.    Complete Medication List: 1)  Multivitamins Caps (Multiple vitamin) .... Once daily 2)  Calcium 600 1500 Mg Tabs (Calcium carbonate) .... Take 1 tablet by mouth once a day 3)  Lisinopril 20 Mg Tabs (Lisinopril) .Marland Kitchen.. 1 tab daily 4)  Aspirin Low Dose 81 Mg Tabs  (Aspirin) .... Once daily 5)  Alendronate Sodium 70 Mg Tabs (Alendronate sodium) .... Take 1 tablet by mouth once a week 6)  Zyrtec Allergy 10 Mg Tabs (Cetirizine hcl) .... Once daily as needed 7)  Albuterol 90 Mcg/act Aers (Albuterol) .... 2 puffs q 4 as needed 8)  Crestor 5 Mg Tabs (Rosuvastatin calcium) .... 5x weekly 9)  Nitroglycerin 0.4 Mg Subl (Nitroglycerin) .Marland Kitchen.. 1 sublingual as needed 10)  Qvar 40 Mcg/act Aers (Beclomethasone dipropionate) .... Inhale 2 puffs once daily 11)  Nexium 40 Mg Cpdr (Esomeprazole magnesium) .Marland Kitchen.. 1 by mouth once daily 12)  Omega 3 1000 Mg Caps (Omega-3 fatty acids) .... Take 3 tablet by mouth once a day 13)  Zantac 150 Mg Tabs (Ranitidine hcl) .Marland Kitchen.. 1 tab by mouth once daily 14)  Prednisone 20 Mg Tabs (Prednisone) .... 2 tab daily po for 5 days  Patient Instructions: 1)  Recommend restart your zyrtec. See if find anything new that you might be reacting too.  2)  Can start the prednisone.   3)  If rash returns then let us know.  Prescriptions: PREDNISONE 20 MG TABS (PREDNISONE) 2 tab daily po for 5 days  #10 x 0   Entered and Authorized by:   Nani Gasser MD   Signed by:   Nani Gasser  MD on 10/23/2010   Method used:   Electronically to        [facility]* (retail)       [address]       [place], [place]  [id number]       Ph: [phone]       Fax: [phone]   RxID:   [id number]    Orders Added: 1)  Est. Patient Level III [33295]

## 2010-11-01 ENCOUNTER — Other Ambulatory Visit: Payer: Self-pay | Admitting: Internal Medicine

## 2010-11-01 ENCOUNTER — Encounter: Payer: Self-pay | Admitting: Internal Medicine

## 2010-11-01 ENCOUNTER — Other Ambulatory Visit: Payer: Medicare Other

## 2010-11-01 ENCOUNTER — Ambulatory Visit (INDEPENDENT_AMBULATORY_CARE_PROVIDER_SITE_OTHER): Payer: Medicare Other | Admitting: Internal Medicine

## 2010-11-01 ENCOUNTER — Ambulatory Visit: Payer: Medicare Other | Admitting: Internal Medicine

## 2010-11-01 ENCOUNTER — Telehealth: Payer: Self-pay | Admitting: Internal Medicine

## 2010-11-01 DIAGNOSIS — R350 Frequency of micturition: Secondary | ICD-10-CM

## 2010-11-01 DIAGNOSIS — R109 Unspecified abdominal pain: Secondary | ICD-10-CM

## 2010-11-01 DIAGNOSIS — I10 Essential (primary) hypertension: Secondary | ICD-10-CM

## 2010-11-01 LAB — URINALYSIS, ROUTINE W REFLEX MICROSCOPIC
Bilirubin Urine: NEGATIVE
Nitrite: NEGATIVE
Specific Gravity, Urine: 1.015 (ref 1.000–1.030)
pH: 8 (ref 5.0–8.0)

## 2010-11-03 ENCOUNTER — Telehealth: Payer: Self-pay | Admitting: Internal Medicine

## 2010-11-03 NOTE — H&P (Signed)
[name], [name]            ACCOUNT NO.:  [id number]  MEDICAL RECORD NO.:  [id number]          PATIENT TYPE:  EMS  LOCATION:  ED                           FACILITY:  [facility]  PHYSICIAN:  Tana Felts, MD     DATE OF BIRTH:  25-Dec-1929  DATE OF ADMISSION:  09/19/2010 DATE OF DISCHARGE:                             HISTORY & PHYSICAL   HISTORY OF PRESENT ILLNESS:  This is a 77 year old woman, with multiple medical problems and a history of diverticulitis a couple of years ago, who presents with left lower quadrant pain consistent with the diagnosis of diverticulitis.  She notes that on Thursday night, she developed pain in the left lower quadrant similar to when she had had diverticulitis in the past.  She thought that she possibly had a virus, but denies any diarrhea or vomiting at the time.  When her symptoms continued and the pain worsened, she went to her primary care physician yesterday who diagnosed her with likely diverticulitis and prescribed Cipro and Flagyl to treat it.  Unfortunately, she has not been able to keep down much food and has had increasing nausea so she came into the emergency room. She also had developed some chest pain and tightness which she was attributing to her abdominal issues.  She does have frequent chest pain with exertion at baseline and has a history of coronary disease.  This was not really worse and has seemed to be improving but has not gone away.  Overnight last night, she was somewhat sweaty and took her temperature, and it said it was close to 100 but not frankly fever.  She denies any sick contacts and did get her flu shot this year.  REVIEW OF SYSTEMS:  A 12 point review of systems is otherwise negative.  PAST MEDICAL HISTORY: 1. Breast cancer. 2. Peptic ulcer disease. 3. Degenerative joint disease of the cervical spine. 4. Right lower lobe BOOP in 2005. 5. Diverticulitis. 6. Cystitis. 7. Chronic rhinitis. 8. Coronary artery  disease, status post drug-eluting stent to the left     circumflex in 2005. 9. Hyperlipidemia. 10.Hypertension. 11.Depression.  SOCIAL HISTORY:  The patient has three daughters and two sons as well as several grandchildren that she sees often, although they do not live near her.  She lives independently in a retirement community and is active.  She does not smoke.  FAMILY HISTORY:  Noncontributory.  ALLERGIES:  DEMEROL, VALIUM, AUGMENTIN, BACTRIM, CODEINE.  CURRENT MEDICATIONS: 1. Daily multivitamin. 2. Calcium 600 mg daily. 3. Lisinopril 20 mg daily. 4. Aspirin 81 mg daily. 5. Alendronate 70 mg weekly. 6. Zyrtec as needed. 7. Albuterol as needed. 8. Crestor 5 mg five times a week. 9. Nitroglycerin 0.4 mg as needed. 10.Qvar 40 mcg two puffs daily. 11.Nexium 40 mg daily. 12.Omega-3 fatty acids 3000 mg daily. 13.Zantac 150 mg daily. 14.She was also just started on January 28 on ciprofloxacin 500 mg     twice a day and Flagyl 500 mg three times a day.  PHYSICAL EXAMINATION:  VITAL SIGNS:  Temperature 98.2, pulse 97, blood pressure 143/80, respiratory rate 14, saturating 100% on room air. GENERAL APPEARANCE:  She is a well-nourished well-developed elderly woman in no acute distress, although she appears mildly uncomfortable. HEENT:  Her pupils are equal, round and reactive to light.  Extraocular movements are intact.  She has no scleral icterus.  Oral mucous membranes are moist.  Her oropharynx is clear. LUNGS:  Clear to auscultation bilaterally. CARDIAC:  Regular rate and rhythm.  No murmurs, rubs or gallops. ABDOMEN:  Diffusely tender with guarding particularly in the epigastric and left lower quadrant regions.  She has normal bowel sounds. EXTREMITIES:  Warm and well-perfused.  No cyanosis, clubbing or edema. SKIN:  No rash or other lesions. NEUROLOGIC:  Grossly intact with no focal deficits.  LABORATORY DATA:  Cardiac enzymes are negative x1.  CMP shows a creatinine of  1.16, BUN of 14, glucose of 119, bilirubin 0.8, alkaline phosphatase of 63, AST 28, ALT 29, albumin 3.6.  Urine showed no white cells, small leukocyte esterase, otherwise unremarkable.  CBC with differential:  White count 7.4, hemoglobin 13.8, platelets 312.  She had a slight monocytosis of 14%, otherwise normal differential.  RADIOLOGY:  Chest x-ray showed normal heart size, low lung volumes and bibasilar atelectasis.  She had some calcified hilar lymph nodes.  A CT of the abdomen and pelvis revealed acute diverticulitis at the junction of the distal descending and proximal sigmoid colon with no evidence of complicating features.  Multiple distal descending colonic diverticula and a small hiatal hernia.  IMPRESSION:  This is a 77 year old woman with coronary disease, irritable bowel syndrome, a history of diverticulitis and peptic ulcer disease, who presents with three days of left lower quadrant abdominal pain and some chest tightness.  Her history and imaging are consistent with acute diverticulitis, and she yesterday was started on appropriate antibiotic therapy.  Her chest pain and tightness seems to be likelyrelated to her baseline disease.  I do not see evidence at this point of acute coronary event, but we will watch her closely.  PLAN: 1. For her acute diverticulitis, she will be continued on Cipro and     Flagyl.  I put her on intravenous therapy at this time given her     nausea, and she can be changed back to oral medications when she is     tolerating p.o. better.  She was started on a clear liquid diet     which can be advanced as tolerated and written for Zofran and     Phenergan as needed for nausea.  She will also continue her Nexium     and Zantac given her history of peptic ulcer disease. 2. Chest pain.  The patient does have a history of coronary disease     and had a stent placed six or seven years ago.  I have continued     her aspirin.  Given that it is a low  dose, she denies any     gastrointestinal bleeding, and I am not familiar with the extent of     her coronary disease.  However, should her hemoglobin drop, it     would be prudent to stop that.  She was put on telemetry.  Her EKG     is unchanged from prior EKG, and [facility] was notified of     her admission. 3. The patient was placed on Lovenox prophylaxis for deep venous     thrombosis.     Tana Felts, MD     NB/MEDQ  D:  09/19/2010  T:  09/19/2010  Job:  [id number]  Electronically Signed by Tana Felts M.D. on 11/03/2010 11:02:48 PM

## 2010-11-09 NOTE — Assessment & Plan Note (Signed)
Summary: ?uti/lb   Vital Signs:  Patient profile:   77 year old female Height:      64 inches Weight:      136 pounds BMI:     23.43 O2 Sat:      96 % on Room air Temp:     98.5 degrees F oral Pulse rate:   113 / minute BP sitting:   100 / 50  (left arm) Cuff size:   regular  Vitals Entered By: Bill Salinas CMA (November 01, 2010 3:48 PM)  O2 Flow:  Room air CC: pt here with c/o back discomfort and urinary freq. Pt states "i just dont feel well"/ ab   Primary Care Provider:  Laryah Neuser  CC:  pt here with c/o back discomfort and urinary freq. Pt states "i just dont feel well"/ ab.  History of Present Illness: [name] presents with a c/o increased urinary frequency such that she is getting up every hour to micturate. She has had no fever, no supr-pubic pain, no dysuria. She has had a h/o urinary frequency and was previously evaluated by [facility] - a trial of Detrol LA was recommended but she was intolerant of the medication.  She does c/o excessive somnolence. Reviewed her chart: she did have thyroid function check March '11 that was normal. She has had recent CBC that revealed normal hemoglobin, Bmet was also normal. She denies major depression. It may be that she is just not getting enough sleep due to the above.  This Am she awoke with pain at the right flank. She does not recall any strain or injury.  Current Medications (verified): 1)  Multivitamins   Caps (Multiple Vitamin) .... Once Daily 2)  Calcium 600 1500 Mg  Tabs (Calcium Carbonate) .... Take 1 Tablet By Mouth Once A Day 3)  Lisinopril 20 Mg Tabs (Lisinopril) .Marland Kitchen.. 1 Tab Daily 4)  Aspirin Low Dose 81 Mg  Tabs (Aspirin) .... Once Daily 5)  Alendronate Sodium 70 Mg  Tabs (Alendronate Sodium) .... Take 1 Tablet By Mouth Once A Week 6)  Zyrtec Allergy 10 Mg  Tabs (Cetirizine Hcl) .... Once Daily As Needed 7)  Albuterol 90 Mcg/act  Aers (Albuterol) .... 2 Puffs Q 4 As Needed 8)  Crestor 5 Mg Tabs (Rosuvastatin Calcium) .... 5x  Weekly 9)  Nitroglycerin 0.4 Mg  Subl (Nitroglycerin) .Marland Kitchen.. 1 Sublingual As Needed 10)  Qvar 40 Mcg/act  Aers (Beclomethasone Dipropionate) .... Inhale 2 Puffs Once Daily 11)  Nexium 40 Mg Cpdr (Esomeprazole Magnesium) .Marland Kitchen.. 1 By Mouth Once Daily 12)  Omega 3 1000 Mg  Caps (Omega-3 Fatty Acids) .... Take 3 Tablet By Mouth Once A Day 13)  Zantac 150 Mg Tabs (Ranitidine Hcl) .Marland Kitchen.. 1 Tab By Mouth Once Daily 14)  Prednisone 20 Mg Tabs (Prednisone) .... 2 Tab Daily Po For 5 Days  Allergies (verified): 1)  ! Demerol 2)  ! Valium 3)  ! Augmentin 4)  ! Sulfamethoxazole-Trimethoprim (Sulfamethoxazole-Trimethoprim) 5)  ! Codeine  Past History:  Past Medical History: Last updated: 05/15/2009 Breast cancer, hx of Peptic ulcer disease IBS DJD cervical spine RLL BOOP '05 hx of Diverticulitis Cystitis chronic rhinitis CAD s/p DES to Lcx 2005 Hyperlipidemia Hypertension Palpitations Depression  Past Surgical History: Last updated: 03/16/2007 right tramflam reconstruction-breast vein stripping ventral hernia repair '02 arthroplasty right thumb '02 Cholecystectomy Hysterectomy Mastectomy Oophorectomy FH reviewed for relevance, SH/Risk Factors reviewed for relevance  Review of Systems  The patient denies anorexia, fever, weight loss, weight gain, decreased hearing,  chest pain, syncope, dyspnea on exertion, prolonged cough, abdominal pain, severe indigestion/heartburn, incontinence, difficulty walking, depression, enlarged lymph nodes, and angioedema.    Physical Exam  General:  alert, well-developed, well-nourished, and well-hydrated older white woman in no acute distress.   Head:  normocephalic and atraumatic.   Eyes:  C&S clear Chest Wall:  no deformities and no tenderness.   Lungs:  normal respiratory effort and normal breath sounds.   Heart:  normal rate and regular rhythm.   Abdomen:  soft and non-tender.   Msk:  Tender to palpation at the posterior right chest, in the  intercostal space at the mid-scapular line and anteriorly. Pulses:  2+ radial Neurologic:  alert & oriented X3, cranial nerves II-XII intact, and gait normal.   Skin:  turgor normal, color normal, no rashes, and no suspicious lesions.   Cervical Nodes:  no anterior cervical adenopathy and no posterior cervical adenopathy.   Psych:  Oriented X3, memory intact for recent and remote, normally interactive, and good eye contact.     Impression & Recommendations:  Problem # 1:  FREQUENCY, URINARY (ICD-788.41) Nocturia every hour and some increase during the day. Dip U/A positive for blood but otherwise negative. Specimen sent to lab for U/a and culture if positive. Her symtoms, however, do not suggest UTI but rather irritible bladder. Discussed this with her in detail including a discussion of the drugs used and the common side effects.  Plan - U/A from lab           Trial of Vesicare 5mg  once daily (sample # 7)  Problem # 2:  ESSENTIAL HYPERTENSION, BENIGN (ICD-401.1) Reveiwed BP readings and they are frequently in the 100 SBP range, at other times in the 120-136 range. She is interested in reducing her medications.  Plan - drug holiday from  lisinopril. She will monitor BP at [facility] and call if BP rises.  Her updated medication list for this problem includes:    Lisinopril 20 Mg Tabs (Lisinopril) .Marland Kitchen... 1 tab daily  Problem # 3:  OTHER MALAISE AND FATIGUE (ICD-780.79) c/o increased somnolence. Suspect it is sleep deprivation from problem #1. If symptoms persist after treatment of #1 will launch further investigation.   Problem # 4:  FLANK PAIN, RIGHT (ICD-789.09) History and exam suggest acute muscle inflammation.  Plan - heat           Careful, limited use of NSAID          May continue with APAP  Her updated medication list for this problem includes:    Aspirin Low Dose 81 Mg Tabs (Aspirin) ..... Once daily  Complete Medication List: 1)  Multivitamins Caps (Multiple vitamin) .... Once  daily 2)  Calcium 600 1500 Mg Tabs (Calcium carbonate) .... Take 1 tablet by mouth once a day 3)  Lisinopril 20 Mg Tabs (Lisinopril) .Marland Kitchen.. 1 tab daily 4)  Aspirin Low Dose 81 Mg Tabs (Aspirin) .... Once daily 5)  Alendronate Sodium 70 Mg Tabs (Alendronate sodium) .... Take 1 tablet by mouth once a week 6)  Zyrtec Allergy 10 Mg Tabs (Cetirizine hcl) .... Once daily as needed 7)  Albuterol 90 Mcg/act Aers (Albuterol) .... 2 puffs q 4 as needed 8)  Crestor 5 Mg Tabs (Rosuvastatin calcium) .... 5x weekly 9)  Nitroglycerin 0.4 Mg Subl (Nitroglycerin) .Marland Kitchen.. 1 sublingual as needed 10)  Qvar 40 Mcg/act Aers (Beclomethasone dipropionate) .... Inhale 2 puffs once daily 11)  Nexium 40 Mg Cpdr (Esomeprazole magnesium) .Marland Kitchen.. 1 by mouth once  daily 12)  Omega 3 1000 Mg Caps (Omega-3 fatty acids) .... Take 3 tablet by mouth once a day 13)  Zantac 150 Mg Tabs (Ranitidine hcl) .Marland Kitchen.. 1 tab by mouth once daily 14)  Prednisone 20 Mg Tabs (Prednisone) .... 2 tab daily po for 5 days  Other Orders: TLB-Udip w/ Micro (81001-URINE)   Orders Added: 1)  TLB-Udip w/ Micro [81001-URINE] 2)  Est. Patient Level III [78295]

## 2010-11-09 NOTE — Progress Notes (Signed)
  Phone Note Other Incoming Message from:  Fax from Pharmacy  Caller: pt  Details for Reason: Intolerance to [employer] of Call: Pt was seen monday and put on Vesicare. Pt states since taking she was extremely drowsy. She states she cannot take this medication and function.  She has stopped med. Pt would like to know what subsitute she could take if there is one.  Initial call taken by: Ami Bullins CMA,  November 03, 2010 5:06 PM  Follow-up for Phone Call        May try oxybutynin 5mg  to take q AM, Rx #30, prn refills - very low dose with usual dose being 5mg  three times a day.  Please update med list if she agrees to try this Follow-up by: Jacques Navy MD,  November 03, 2010 9:53 PM  Additional Follow-up for Phone Call Additional follow up Details #1::        Pt states she does not want to take anything else. She states she will deal with getting up and going to the bathroom.  Additional Follow-up by: Ami Bullins CMA,  November 05, 2010 9:43 AM    Additional Follow-up for Phone Call Additional follow up Details #2::    k Follow-up by: Jacques Navy MD,  November 05, 2010 11:27 AM

## 2010-11-27 LAB — CARDIAC PANEL(CRET KIN+CKTOT+MB+TROPI)
CK, MB: 2.2 ng/mL (ref 0.3–4.0)
CK, MB: 2.5 ng/mL (ref 0.3–4.0)
CK, MB: 2.6 ng/mL (ref 0.3–4.0)
Relative Index: 1.2 (ref 0.0–2.5)
Total CK: 222 U/L — ABNORMAL HIGH (ref 7–177)
Total CK: 235 U/L — ABNORMAL HIGH (ref 7–177)
Troponin I: 0.03 ng/mL (ref 0.00–0.06)

## 2010-11-27 LAB — COMPREHENSIVE METABOLIC PANEL
Albumin: 3.7 g/dL (ref 3.5–5.2)
Alkaline Phosphatase: 56 U/L (ref 39–117)
BUN: 13 mg/dL (ref 6–23)
Creatinine, Ser: 1.21 mg/dL — ABNORMAL HIGH (ref 0.4–1.2)
Potassium: 4.3 mEq/L (ref 3.5–5.1)
Total Protein: 7.9 g/dL (ref 6.0–8.3)

## 2010-11-27 LAB — CBC
HCT: 41.7 % (ref 36.0–46.0)
MCHC: 33.9 g/dL (ref 30.0–36.0)
Platelets: 283 10*3/uL (ref 150–400)
Platelets: 306 10*3/uL (ref 150–400)
RDW: 13.1 % (ref 11.5–15.5)
RDW: 13.3 % (ref 11.5–15.5)

## 2010-11-27 LAB — DIFFERENTIAL
Basophils Absolute: 0 10*3/uL (ref 0.0–0.1)
Basophils Relative: 0 % (ref 0–1)
Neutro Abs: 6.2 10*3/uL (ref 1.7–7.7)
Neutrophils Relative %: 74 % (ref 43–77)

## 2010-11-27 LAB — LIPID PANEL
LDL Cholesterol: 154 mg/dL — ABNORMAL HIGH (ref 0–99)
Triglycerides: 44 mg/dL (ref <150)
VLDL: 9 mg/dL (ref 0–40)

## 2010-11-27 LAB — AMYLASE: Amylase: 44 U/L (ref 27–131)

## 2010-12-27 ENCOUNTER — Telehealth: Payer: Self-pay | Admitting: *Deleted

## 2010-12-27 ENCOUNTER — Encounter: Payer: Self-pay | Admitting: Endocrinology

## 2010-12-27 ENCOUNTER — Ambulatory Visit (INDEPENDENT_AMBULATORY_CARE_PROVIDER_SITE_OTHER): Payer: Medicare Other | Admitting: Endocrinology

## 2010-12-27 ENCOUNTER — Other Ambulatory Visit (INDEPENDENT_AMBULATORY_CARE_PROVIDER_SITE_OTHER): Payer: Medicare Other

## 2010-12-27 ENCOUNTER — Other Ambulatory Visit: Payer: Self-pay | Admitting: Internal Medicine

## 2010-12-27 ENCOUNTER — Other Ambulatory Visit (INDEPENDENT_AMBULATORY_CARE_PROVIDER_SITE_OTHER): Payer: Medicare Other | Admitting: Internal Medicine

## 2010-12-27 VITALS — BP 128/80 | HR 97 | Temp 97.5°F | Ht 60.0 in | Wt 142.4 lb

## 2010-12-27 DIAGNOSIS — R51 Headache: Secondary | ICD-10-CM

## 2010-12-27 DIAGNOSIS — Z79899 Other long term (current) drug therapy: Secondary | ICD-10-CM

## 2010-12-27 DIAGNOSIS — Z13220 Encounter for screening for lipoid disorders: Secondary | ICD-10-CM

## 2010-12-27 DIAGNOSIS — E7800 Pure hypercholesterolemia, unspecified: Secondary | ICD-10-CM

## 2010-12-27 LAB — BASIC METABOLIC PANEL
CO2: 24 mEq/L (ref 19–32)
Calcium: 9.3 mg/dL (ref 8.4–10.5)
Chloride: 106 mEq/L (ref 96–112)
Creatinine, Ser: 1.2 mg/dL (ref 0.4–1.2)
Sodium: 142 mEq/L (ref 135–145)

## 2010-12-27 LAB — HEPATIC FUNCTION PANEL
ALT: 20 U/L (ref 0–35)
AST: 24 U/L (ref 0–37)
Albumin: 3.8 g/dL (ref 3.5–5.2)
Alkaline Phosphatase: 50 U/L (ref 39–117)
Bilirubin, Direct: 0 mg/dL (ref 0.0–0.3)
Total Bilirubin: 0.7 mg/dL (ref 0.3–1.2)
Total Protein: 6.8 g/dL (ref 6.0–8.3)

## 2010-12-27 LAB — LDL CHOLESTEROL, DIRECT: Direct LDL: 161.7 mg/dL

## 2010-12-27 LAB — LIPID PANEL
Cholesterol: 241 mg/dL — ABNORMAL HIGH (ref 0–200)
HDL: 63 mg/dL
Total CHOL/HDL Ratio: 4
Triglycerides: 94 mg/dL (ref 0.0–149.0)
VLDL: 18.8 mg/dL (ref 0.0–40.0)

## 2010-12-27 MED ORDER — VALACYCLOVIR HCL 1 G PO TABS: 1000.0000 mg | ORAL_TABLET | Freq: Three times a day (TID) | ORAL | Status: DC

## 2010-12-27 MED ORDER — HYDROCODONE-ACETAMINOPHEN 5-325 MG PO TABS: ORAL_TABLET | ORAL | Status: DC

## 2010-12-27 NOTE — Telephone Encounter (Signed)
Pt was not happy with dx of shingles at [facility] w/Dr Everardo All today. She would like Dr Debby Bud to advise regarding the Rx's given today. She was given valtrex and hydrocodone. Pt lives alone and is very worried about how hydrocodone "knocks" her out, even 1/2 tab. She is very uncomfortable but only wants advisement from Dr Debby Bud, per pt, she ONLY trusts him. I advised her to take tylenol (she had not tried any otc meds yet today) and I would let Dr Debby Bud know her concerns.   Should she take they hydrocodone if they tylenol alone does not help? Should she take the valtrex? Or wait until her apt w/Dr Norins tomorrow at 3:45?  BMP and Sed Rate were ordered today - she does not want to do these until Dr Debby Bud agrees. She will have these done tomorrow at OV if needed.

## 2010-12-27 NOTE — Progress Notes (Signed)
  Subjective:    Patient ID: [name], female    DOB: 03-30-1930, 77 y.o.   MRN: [id number]  HPI Pt states 12 hrs of moderate pain at the left side of the face and neck.  No assoc numbness.  Pain is exac by chewing, but she says the pain is not in the teeth.  She says she can safely take codeine by mouth.  Review of Systems Denies visual loss and fever.    Objective:   Physical Exam GENERAL: no distress head: no deformity eyes: no periorbital swelling, no proptosis external nose and ears are normal mouth: no lesion seen At the left face and neck, there is no /erythema/swelling/rash, but the area is exquisitely tender.      Assessment & Plan:  Facial pain, uncertain etiology.  ? Trigeminal neuralgia, vs zoster.

## 2010-12-27 NOTE — Patient Instructions (Addendum)
i have sent a prescription for a special antibiotic to your pharmacy. blood tests are being ordered for you today.  please call [phone] to hear your test results.  You will be prompted to enter the 9-digit "MRN" number that appears at the top left of this page, followed by #.  Then you will hear the message. I hope feel better soon.  If you don't feel better in a day or 2, please call doctor norins. Here is a prescription for a pain pill.

## 2010-12-27 NOTE — Telephone Encounter (Signed)
Pain but no rash on Dr. George Hugh exam. It may be early, prerash zoster. Recommend taking the valtrex 1g tid - no harm from the drug. Hold off on the hydrocodone. Knowing that she has h/o PUD still would recommend taking two aleve tonight. Add on first in AM

## 2010-12-27 NOTE — Telephone Encounter (Signed)
Patient informed, she will come in at 88

## 2010-12-28 ENCOUNTER — Encounter: Payer: Self-pay | Admitting: Internal Medicine

## 2010-12-28 ENCOUNTER — Ambulatory Visit (INDEPENDENT_AMBULATORY_CARE_PROVIDER_SITE_OTHER): Payer: Medicare Other | Admitting: Internal Medicine

## 2010-12-28 ENCOUNTER — Ambulatory Visit: Payer: Medicare Other | Admitting: Internal Medicine

## 2010-12-28 VITALS — BP 120/60 | HR 99 | Temp 98.1°F

## 2010-12-28 DIAGNOSIS — M26629 Arthralgia of temporomandibular joint, unspecified side: Secondary | ICD-10-CM

## 2010-12-28 NOTE — Patient Instructions (Signed)
Facial pain - I think that the primary cause of pain is TMJ - arthritis of the jaw joint. I cannot be sure that this is not shingles although having had the vaccine it is less likely. Plan - finish the valtrex. Use aspercreme over the external aspect of the left TMJ. If you can take it without discomfort you may also use aleve twice a day. Avoid over-extension of the jaw: no gum, small bites.   TMJ Problems  (Temporal Mandibular Joint Dysfunction) TMJ dysfunction means there are problems with the joint between your jaw and your skull. This is a joint lined by cartilage like other joints in your body but also has a small disc in the joint which keeps the bones from rubbing on each other. These joints are like other joints and can get inflamed (sore) from arthritis and other problems. When this joint gets sore, it can cause headaches and pain in the jaw and the face. CAUSES Usually the arthritic types of problems are caused by soreness in the joint. Soreness in the joint can also be caused by overuse. This may come from grinding your teeth. It may also come from mis-alignment in the joint. DIAGNOSIS (LEARNING WHAT IS WRONG) Diagnosis of this condition can often be made by history and exam. Sometimes your caregiver may need X-rays or an MRI scan to determine the exact cause. It may be necessary to see your dentist to determine if your teeth and jaws are lined up correctly. TREATMENT Most of the time this problem is not serious; however, sometimes it can persist (become chronic). When this happens medications that will cut down on inflammation (soreness) help. Sometimes a shot of cortisone into the joint will be helpful. If your teeth are not aligned it may help for your dentist to make a splint for your mouth that can help this problem. If no physical problems can be found, the problem may come from tension. If tension is found to be the cause, biofeedback or relaxation techniques may be helpful. HOME CARE  INSTRUCTIONS  Later in the day, applications of ice packs may be helpful. Ice can be used in a plastic bag with a towel around it to prevent frostbite to skin. This may be used about every 2 hours for 20 to 30 minutes, as needed while awake, or as directed by your caregiver.   Only take over-the-counter or prescription medicines for pain, discomfort, or fever as directed by your caregiver.   If physical therapy was prescribed, follow your caregiver's directions.   Wear mouth appliances as directed if they were given.  Document Released: 05/03/2001 Document Re-Released: 11/04/2008 [facility] Patient Information 2011 [place], [place].

## 2010-12-28 NOTE — Progress Notes (Signed)
  Subjective:    Patient ID: [name], female    DOB: [birth year], 77 y.o.   MRN: [id number]  HPI [name] was seen yesterday by Dr. Everardo All for left facial swelling and severe pain in the preauricular area. This was of sudden on-set. Of note she had reconstructive surgery many years ago for what sounds like a dislocating right TMJ. She has had shingles vaccine. She was diagnosed with pre-rash zoster and was started on valtrex and narcotic pain medication. She has been taking the valtrex but not the hydrocodone. She returns for re-evalutaion.   PMH, FamHx and SocHx reviewed for any changes and relevance.    Review of Systems Review of Systems  Constitutional:  Negative for fever, chills, activity change and unexpected weight change.  HENT:  Negative for hearing loss, ear pain, congestion, neck stiffness and postnasal drip.   Eyes: Negative for pain, discharge and visual disturbance.  Respiratory: Negative for chest tightness and wheezing.   Cardiovascular: Negative for chest pain and palpitations.       [No decreased exercise tolerance Gastrointestinal: [No change in bowel habit. No bloating or gas. No reflux or indigestion Genitourinary: Negative for urgency, frequency, flank pain and difficulty urinating.  Musculoskeletal: Negative for myalgias, back pain, arthralgias and gait problem.  Neurological: Negative for dizziness, tremors, weakness and headaches.  Hematological: Negative for adenopathy.  Psychiatric/Behavioral: Negative for behavioral problems and dysphoric mood.       Objective:   Physical Exam WNWD white woman in no distress HEENT - mild swelling left side of her face. Left EAC and TM normal. Very tender to palpation over the left TMJ externally and from the posterior aspect in the EAC. Tender in the submandibular region without adenopathy. Cor- RRR Derm - no rash noted left face, scalp or neck.       Assessment & Plan:  1. TMJ - most likely cause of  pain is the TMJ  Plan - aspercreme, aleve bid if tolerated           Small bites, no gum. Pt ed handout printed  2. Shingles- no rash and she did have vaccine. However, best advice is to complete valtrex just in case.

## 2010-12-29 ENCOUNTER — Ambulatory Visit: Payer: Medicare Other | Admitting: Internal Medicine

## 2010-12-30 ENCOUNTER — Ambulatory Visit (INDEPENDENT_AMBULATORY_CARE_PROVIDER_SITE_OTHER): Payer: Medicare Other

## 2010-12-30 ENCOUNTER — Ambulatory Visit: Payer: Self-pay

## 2010-12-30 VITALS — Wt 141.0 lb

## 2010-12-30 DIAGNOSIS — E785 Hyperlipidemia, unspecified: Secondary | ICD-10-CM

## 2010-12-30 NOTE — Assessment & Plan Note (Signed)
Pt's cholesterol worse since last visit.  TC- 241 (goal<200), TG- 94 (goal<150), HDL- 63 (goal>45), and LDL- 161 (goal<100).  Question patient's compliance given LDL worse than when she was off Crestor.  Will increase Crestor to 5mg  every day. She is hesitant to increase past this point.  We discussed risk/benefit of staying on cholesterol medications long term given her age.  Since this is for secondary prevention, will try to continue statin as long as patient is willing.  Encouraged patient to try to keep a normal diet and exercise routine while traveling.  Will check lipids again in 6 months.

## 2010-12-30 NOTE — Patient Instructions (Signed)
Increase Crestor to 5mg  daily   Continue all of your other medications.   Try to walk in the hallways or on the treadmill as much as possible.   Recheck labs in 6 months.

## 2010-12-30 NOTE — Progress Notes (Signed)
HPI  [name] presents today for follow-up lipid consult. She does not complain of chest pain or SOB.  She does have some calf pain when she lays down at night but not enough to make her stop taking medication.  She is currently taking Crestor 5mg  five days a week as well as fish oil 3gm daily. She did have a bout of diverticulitis in January and February and did not take any of her medications for approximately 3 weeks.  This has now resolved and patient states she is compliant with Crestor but she never had it filled from the pharmacy.  She has not picked up additional samples from our office so unsure if she has been compliant or not.     Pt's diet is quite different since diagnosed with diverticulitis.  She avoids all corn, nuts, red meat and salads.  She likes to teat oatmeal, chicken, and soup. Her exercise decreased significantly during the end of the winter.  She is trying to walk ~ 1 mile down the hallways of her home as often as possible.  She is going to see her son in [place] next week and will walk on the treadmill there.  She has many trips planned this summer so it will be difficult for her to maintain a normal diet and exercise routine.   Current Outpatient Prescriptions  Medication Sig Dispense Refill  . albuterol (PROVENTIL,VENTOLIN) 90 MCG/ACT inhaler Inhale 2 puffs into the lungs every 4 (four) hours as needed.        Marland Kitchen alendronate (FOSAMAX) 70 MG tablet Take 70 mg by mouth every 7 (seven) days. Take with a full glass of water on an empty stomach.       Marland Kitchen aspirin 81 MG tablet Take 81 mg by mouth daily.        . beclomethasone (QVAR) 40 MCG/ACT inhaler Inhale 2 puffs into the lungs daily as needed.       . calcium carbonate (OS-CAL) 600 MG TABS Take 600 mg by mouth daily.        . cetirizine (ZYRTEC) 10 MG tablet Take 10 mg by mouth daily as needed.        Marland Kitchen esomeprazole (NEXIUM) 40 MG capsule Take 40 mg by mouth daily before breakfast.        . fish oil-omega-3 fatty acids 1000 MG  capsule 3 capsules by mouth once a day       . lisinopril (PRINIVIL,ZESTRIL) 20 MG tablet Take 20 mg by mouth daily.        . Multiple Vitamin (MULTIVITAMIN) capsule Take 1 capsule by mouth daily.        . nitroGLYCERIN (NITROSTAT) 0.4 MG SL tablet Place 0.4 mg under the tongue every 5 (five) minutes as needed.        . ranitidine (ZANTAC) 150 MG tablet Take 150 mg by mouth daily.        . rosuvastatin (CRESTOR) 5 MG tablet Take 5 mg by mouth daily.       . valACYclovir (VALTREX) 1000 MG tablet Take 1 tablet (1,000 mg total) by mouth 3 (three) times daily.  21 tablet  0  . DISCONTD: HYDROcodone-acetaminophen (NORCO) 5-325 MG per tablet 1/2 or 1 pill every 4 hrs as needed for pain  30 tablet  0    Allergies  Allergen Reactions  . Augmentin Diarrhea  . Codeine Nausea And Vomiting    Over-sedating  . Diazepam     Hallucination   . Meperidine Hcl  Nausea Only  . Sulfamethoxazole W/Trimethoprim Diarrhea

## 2011-01-04 ENCOUNTER — Other Ambulatory Visit (INDEPENDENT_AMBULATORY_CARE_PROVIDER_SITE_OTHER): Payer: Medicare Other

## 2011-01-04 ENCOUNTER — Encounter: Payer: Self-pay | Admitting: Internal Medicine

## 2011-01-04 ENCOUNTER — Ambulatory Visit (INDEPENDENT_AMBULATORY_CARE_PROVIDER_SITE_OTHER): Payer: Medicare Other | Admitting: Internal Medicine

## 2011-01-04 VITALS — BP 110/64 | HR 98 | Temp 98.3°F | Wt 142.0 lb

## 2011-01-04 DIAGNOSIS — R413 Other amnesia: Secondary | ICD-10-CM

## 2011-01-04 DIAGNOSIS — R51 Headache: Secondary | ICD-10-CM

## 2011-01-04 LAB — TSH: TSH: 2.76 u[IU]/mL (ref 0.35–5.50)

## 2011-01-04 LAB — BASIC METABOLIC PANEL
BUN: 15 mg/dL (ref 6–23)
CO2: 28 mEq/L (ref 19–32)
Chloride: 105 mEq/L (ref 96–112)
Creatinine, Ser: 0.9 mg/dL (ref 0.4–1.2)
Potassium: 4.5 mEq/L (ref 3.5–5.1)

## 2011-01-04 NOTE — Consult Note (Signed)
NAMEYURI, FLENER            ACCOUNT NO.:  [id number]   MEDICAL RECORD NO.:  [id number]          PATIENT TYPE:  INP   LOCATION:  1241                         FACILITY:  [facility]   PHYSICIAN:  Jesse Sans. Wall, MD, FACCDATE OF BIRTH:  Jul 16, 1930   DATE OF CONSULTATION:  03/31/2009  DATE OF DISCHARGE:                                 CONSULTATION   CHIEF COMPLAINT:  Pressure in my upper abdomen and lower chest.   HISTORY OF PRESENT ILLNESS:  Ms. Janyra Barillas is a delightful 77-  year-old white female who was admitted to the hospital yesterday by Dr.  Illene Regulus for presumed acute diverticulitis.   She presented over the weekend to the office with lower abdominal pain  and nausea.  She was started on Flagyl and Cipro.  She saw Dr. Debby Bud  yesterday who was concerned about her being orthostatic and admitted her  for intravenous antibiotics and further evaluation.   Her abdominal CT did not show any acute diverticulitis or abscess, but  did show diverticulosis.  There was no other intra-abdominal pathology.   This morning, she developed a heaviness or pressure in her left upper  quadrant subxiphoid area.  It did not radiate.  She had some nausea.  She was not short of breath, did not become diaphoretic.   A stat EKG showed no acute changes, but she does have more worsening of  R wave progression across the anterior precordium which may be lead  placement.  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  I have pulled  her EKG from 2008, and they are really unchanged in the inferior leads.   Her cardiac history is significant for previous episode of what sounds  like classic unstable angina with a subsequent drug-eluting stent placed  to the circumflex.  She has nonobstructive disease otherwise.  She has  normal left ventricular systolic function.  Of note, she had a stress  nuclear study for some similar  chest pressure back in October 2008 at  which time she had a EF of 79% with no ischemia.  Because she had pain  during the stress test, she was seen by Dr. Bonnee Quin in the office  that day and subsequently admitted for cardiac catheterization.  Catheterization showed patency of the stent with nonobstructive disease.   She last saw Dr. Jens Som in the office in March 2010.  I have reviewed  that note at length.  She was doing remarkably well at that time.  Dr.  Ludwig Clarks notes in the past relate a lot of nonexertional chest  discomfort, somewhat similar to today.  Of note, she wanted to stop her  Plavix back in June 2009, and it was discontinued at that time.  A  slight increased risk of thrombosis at that time was explained by Dr.  Jens Som.   She had been moved down to the ICU and is on IV nitroglycerin at  present.  We are concerned about using the heparin or aspirin because  this may be gastritis or peptic ulcer.  She is being evaluated by  Gastroenterology as well.   She is currently pain free and in no acute distress.  She is in a joking  mood with the chaplain as well as her daughters in the room.   CURRENT MEDICATIONS:  Medication as an outpatient are:  1. Multivitamins one tablet daily.  2. Calcium 600 1500 mg tabs one tablet by mouth daily.  3. Avapro 150 mg a day.  4. Aspirin 81 mg per day.  5. Alendronate 70 mg weekly.  6. Zyrtec 10 mg as needed.  7. Albuterol 2 puffs q. 4 h as needed.  8. Crestor 5 mg three times a week.  9. Nitroglycerin p.r.n.  10.Qvar inhaler one puff twice a day.  11.Zantac 150 mg two times a day.  12.Omega-3 one tablet daily.  13.Cipro one tablet two times a day for 7 days.  14.Flagyl 500 mg three times a day for 7 days.   These antibiotics have been changed in the hospital, and she is no  longer on antibiotics.  She is on low-dose enoxaparin 4 mg subcu q. 24.  She is also on metoprolol 25 mg which she received this morning.   ALLERGIES:   PENICILLIN, CODEINE, DEMEROL, VALIUM.  SHE HAS NO CONTRAST  ALLERGY.   SURGICAL HISTORY:  History is extensive.  She has had:  1. Vein stripping.  2. Ventral hernia.  3. Arthroplasty right thumb in 2002.  4. Cholecystectomy.  5. Hysterectomy.  6. Mastectomy.  7. Oophorectomy.  8. Reconstruction of breasts in the past.   PAST MEDICAL HISTORY:  1. History of breast cancer.  2. Peptic ulcer disease.  3. Irritable bowel syndrome.  4. Degenerative joint disease of cervical spine.  5. Right lower lobe BOOP in 2005.  6. History of diverticulitis.  7. Cystitis.  8. Chronic rhinitis.  9. Hyperlipidemia,.  10.Hypertension.   FAMILY HISTORY:  Remarkable for asthma, breast cancer, COPD, but no  premature heart problems.   SOCIAL HISTORY:  She is widowed.  She is very active.  She lives  independently.  She has several children in [place] and [place] the [address]  She is very involved her family.   PHYSICAL EXAMINATION:  GENERAL:  She is currently in no acute distress.  She looks younger than stated age.  VITAL SIGNS:  Blood pressure is 125/65, pulse is 70 and regular.  She is  on telemetry and sinus.  Respirations 20, temperature is 98.6, sats 99%  on room air.  SKIN:  Warm and dry.  She has good teeth.  Oral mucosa is normal.  NECK:  Supple.  Carotid upstrokes are equal bilateral without bruits.  No JVD.  Thyroid is not enlarged.  Trachea is midline.  CHEST:  Lungs  clear to auscultation and percussion.  HEART:  Reveals a nondisplaced PMI.  Normal S1-S2.  No murmur, rub or  gallop.  ABDOMEN:  Diffusely tender.  I did not press deeply.  Bowel sounds are  hypoactive but present.  EXTREMITIES:  No cyanosis, clubbing or edema.  Pulses are intact.  There  is no sign of DVT.  NEUROLOGICAL:  Grossly intact.  SKIN:  Unremarkable.  MUSCULOSKELETAL:  Chronic arthritic changes.   LABORATORY DATA/X-RAYS:  All x-rays and laboratory data have been  reviewed.  Specifically, her first set of  enzymes show a total CK that  is mildly elevated to 222, normal MB and normal troponin.  Her amylase  is negative.  Lipase is negative.  Rest  of her blood work is  unremarkable.   ASSESSMENT:  1. Left upper quadrant discomfort as well as subxiphoid discomfort,      history peptic ulcer disease, rule out GI source would be my best      impression.  She does have a history of coronary artery disease and      has a DES to the left circumflex.  She could have silent ischemic      changes on her EKG with a posterior lesion such as a circumflex.      However, she does not look acutely ill, does not look like she is      having acute coronary syndrome.  Her EKG is stable.  She has had      pain like this in the past with a negative stress Myoview and a      negative repeat cath in 2008.  2. Other problems as listed above.   RECOMMENDATIONS:  1. Keep in telemetry.  2. IV nitro low-dose  3. Hold heparin and aspirin per Dr. Debby Bud' and my discussion.  We are      worried about a GI bleed.  4. Continue low-dose metoprolol 25 mg at present time.  5. Echocardiogram to assess anterior and inferior walls with the      abnormal EKG findings.   I spent a long time talking to Dr. Debby Bud at the bedside with the  patient and her daughters.  Every one understands the game plan.   She rules out for myocardial infarction, and no GI etiology is found.  She may need a repeat outpatient Myoview with Dr. Jens Som.      Thomas C. Daleen Squibb, MD, [facility]  Electronically Signed     TCW/MEDQ  D:  03/31/2009  T:  03/31/2009  Job:  [id number]

## 2011-01-04 NOTE — Discharge Summary (Signed)
[name], [name]            ACCOUNT NO.:  [id number]   MEDICAL RECORD NO.:  [id number]          PATIENT TYPE:  INP   LOCATION:  1311                         FACILITY:  [name]   PHYSICIAN:  Mariah Gess. Norins, MD  DATE OF BIRTH:  1930-01-23   DATE OF ADMISSION:  03/30/2009  DATE OF DISCHARGE:  04/02/2009                               DISCHARGE SUMMARY   ADMISSION DIAGNOSES:  1. Abdominal pain/diverticulitis.  2. Dehydration.   DISCHARGE DIAGNOSES:  1. Abdominal pain secondary to viral gastroenteritis, improved.  2. Peptic ulcer disease ruled out.  3. MRI/unstable angina ruled out.   CONSULTANTS:  1. [name] for cardiology.  2. [name] for gastroenterology.   PROCEDURES:  1. CT scan of the abdomen and pelvis performed the day of admission      read out as no acute intra-abdominal findings, diverticulosis with      no evidence of diverticulitis or abscess, pelvis with no acute      findings in the pelvis, diverticulosis noted with no evidence of      diverticulitis or abscess.  2. Chest x-ray August 10, which showed chronic lung disease with no      acute cardiopulmonary disease.  3. Abdominal film portable, which showed no acute abnormality with      normal bowel gas pattern.  No dilated loops of bowel.  Small amount      of contrast remaining in the colon. __________vertebral      augmentation at L2.   HISTORY OF PRESENT ILLNESS:  [name] is a 77 year old woman who had  several week history of persistent nausea and abdominal discomfort.  She  did develop significant lower abdominal pain and discomfort.  She was  seen by [name] at [name], diagnosed with  diverticulitis, and was started on Cipro and Flagyl.  The patient  reports she continued to have abdominal pain and discomfort as well as  nausea, being unable to eat but she was able to keep down fluids.  She  was seen in the office on the day of admission and had an  orthostatic  drop in blood pressure from 130/80 supine to 110/76 standing with a rise  in heart rate from 106 supine to 115 standing.  She was admitted for  further evaluation and IV fluid support.   PAST MEDICAL HISTORY/FAMILY HISTORY/SOCIAL HISTORY/ADMITTING  EXAMINATION:  Please see the MR generated H&P for past medical history,  family history, social history as well as admitting examination.   [name] COURSE:  1. GASTROINTESTINAL:  The patient was ruled out for diverticulitis      with a negative CT scan of the abdomen and pelvis.  In addition,      her laboratory revealed a normal white blood count.  It was felt      that her abdominal pain may be from acute gastritis versus peptic      ulcer disease source.  Her lower abdominal pain was thought      possibly related to a viral gastroenteritis.  She was continued on  IV Protonix for 48 hours..  She was seen in consultation by the GI      service.  Abdominal films were obtained in followed which showed no      free air or other signs of acute abdomen.  The patient was taken to      the endoscopy suite for upper endoscopy performed April 01, 2009,      which revealed a hiatal hernia, small to medium in size. She had      Schatzki's ring but this was not dilated for lack of dysphagia      symptoms.  This was otherwise a normal examination.  The patient      was able to advance her diet without difficulty.  She was continued      on oral PPI therapy and GI recommended 30 days at b.i.d. dosing      with PPI and then PPI therapy daily for several more months.   With the patient being able to keep down food and fluids, with her  nausea being abated, and with her being able to ambulate with normal  bowel movements, it was felt she was stable for discharge.  1. CARDIOVASCULAR:  The patient did have severe left upper      quadrant/left chest discomfort associated with diaphoresis.  She      had history of CAD.  She had an EKG  performed which showed possible      ischemic type changes.  Because of her discomfort and her history,      she was seen in consultation by [name] for the cardiology      service.   This excellent evaluation included a review of her past history which  showed she had an episode of chest discomfort similar to this sharp pain  in October 2008.  Stress test was done at that time which showed an EF  of 79% with no ischemic changes.  She did have pain during the stress  test which led her to come to cardiac catheterization which revealed  patency of her stent and no other obstructive disease.   No intervention was felt to be indicated.  The patient did have cardiac  enzymes cycled which were negative x3 with CK of 222 to 235 to 216 with  a troponin-I of 0.03 to 0.04 to 0.03.  With the patient having normal  cardiac enzymes, with a recent cardiac catheterization, and with  resolution of her discomfort, it was not felt she had any further  cardiology evaluation.   With the patient's GI symptoms being markedly improved, with her being  ruled out for any acute cardiac disease, she is now ready for discharge  home.   PHYSICAL EXAMINATION:  VITAL SIGNS:  Temperature of 98.2, blood pressure  130/82, heart rate was 96, respirations 20, O2 sat's 99% on room air.  The patient had orthostatic vital signs on the day of discharge with a  blood pressure of 110/68 supine, 78/49 standing at 0600 hours..  The  patient was kept throughout the day.  She was IV hydrated.  She was able  to ambulate without lightheadedness or dizziness.  It was felt that she  was stable in terms of her vital signs.  ABDOMEN:  Examination revealed  the patient had a soft abdomen with no guarding or rebound.  He had  positive bowel sounds all four quadrants.  CARDIOVASCULAR:  Examination  revealed  2+ radial pulse.  Precordium was quiet.  She  had a regular  rate and rhythm.  LUNGS:  Clear to auscultation and  percussion.   LABORATORY DATA:  Final laboratory studies:  Cardiac enzymes as noted.  Amylase on the 10th was normal at 44, lipase was normal at 23.  Final  CBC August 10 with a white count of 8400, hemoglobin 12.9 grams,  platelet count 283,000.  Differential was normal.  TSH on March 30, 2009  was normal at 4.9.  Lipid profile on March 30, 2009 with a cholesterol  of 217, HDL 54, LDL 154, triglycerides were 44.   DISCHARGE MEDICATIONS:  She will continue all of her home medications  including multivitamins, calcium, Avapro 150 mg daily, aspirin 81 mg  daily, alendronate 70 mg weekly, Zyrtec 10 mg daily for allergies as  needed, albuterol MDI 2 puffs q. 4 p.r.n., Crestor 10 mg daily,  nitroglycerin 0.4 mg sublingual p.r.n., QVAR 40 mcg per 1 inhalation  b.i.d. omega III 1000 mg daily, Protonix 40 mg b.i.d. for 30 days then  40 mg daily for 5 months, promethazine 25 mg 1 p.o. q.6 h p.r.n. nausea.   DISPOSITION:  Is for the patient return home.  She is closely monitored  by her family.   FOLLOWUP:  1. She will return to see Dr. Debby Bud in the office on an as-needed      basis.      Mariah Gess Norins, MD  Electronically Signed     MEN/MEDQ  D:  04/02/2009  T:  04/02/2009  Job:  [id number]   cc:   Thomas C. Wall, MD, FACC  [address]  [address]  [place]  [place] [address]   Rachael Fee, MD  [address]  [place], [place] [address]

## 2011-01-04 NOTE — Assessment & Plan Note (Signed)
[facility]                               LIPID CLINIC NOTE   NAME:[name], Mariah LINEHAN                   MRN:          [id number]  DATE:06/14/2007                            DOB:          [birth year]    PRIMARY CARE PHYSICIAN:  Rosalyn Gess. Norins, MD   [name] is [name] in Lipid Clinic for evaluation of medication  titration associated with her hyperlipidemia and history of difficulty  tolerating multiple lipid lowering therapies.   [name] called in to let me know that a cholestyramine which we tried  did not work for her that she has had significant GI distress and nausea  starting approximately 2 hours after each dose of cholestyramine powder.  She also states that she has had increased overnight frequency in  urination while taking this medication. Something that I believe is  probably not affiliated or associated with this medication.   In review, the patient did not have problems tolerating Welchol 6  tablets daily in two divided doses. However, we were trying to stay away  from this agent due to prohibitive cost. Therefore, though I do not have  samples, I have contacted Dr.  Debby Bud office and his nurse has agreed to  place additional week or two of Welchol at the desk for the patient. I  have called to the pharmacy at [facility] to let them know to put a  prescription on file for Welchol six tablets a day for [name] and I  have spoken with her in the office today as she was in for tests to go  over all of this with.   Time spent with the patient was 25 minutes.      Shelby Dubin, PharmD, BCPS, CPP  Electronically Signed      Rollene Rotunda, MD, [facility]  Electronically Signed   MP/MedQ  DD: 06/14/2007  DT: 06/14/2007  Job #: [id number]

## 2011-01-04 NOTE — H&P (Signed)
[name], [name]            ACCOUNT NO.:  [id number]   MEDICAL RECORD NO.:  [id number]          PATIENT TYPE:  INP   LOCATION:  4709                         FACILITY:  [facility]   PHYSICIAN:  Arturo Morton. Riley Kill, MD, FACCDATE OF BIRTH:  1930-06-08   DATE OF ADMISSION:  06/14/2007  DATE OF DISCHARGE:                              HISTORY & PHYSICAL   CHIEF COMPLAINT:  Chest pain.   HISTORY OF PRESENT ILLNESS:  Ms. Shingler is a 77 year old white female  patient of Dr. Wyonia Hough and Olga Millers.  She has a history of  coronary artery disease and underwent stenting of the circumflex  coronary artery in August 2005.  At that time she had nonobstructive  plaque in the other vessels.  She had a Cypher stent placed.  More  recently she has had a fair amount of chest discomfort that seems to  have been getting worse over the past couple of months.  It occurs  frequently and it is described as a pressure as opposed to severe pain  which she had previously.  Today she was brought in for a radionuclide  imaging study by Dr. Jens Som, and had pain at the beginning of the  procedure which progressed, and then stayed at 5/10.  She is now sitting  relatively comfortably but with continued mild pressure-type sensation  in the chest.  She is therefore now admitted for observation.   PAST MEDICAL HISTORY:  Is long and complicated.  1. She has a history of hypertension.  2. Hyperlipidemia.  3. Family history.  4. She has had prior peptic ulcer disease in the past with severe      gastritis.  5. She has had diverticulitis.  6. She has had chronic bursitis of the right shoulder.  7. There has been irritable bowel.  8. History of superficial phlebitis.  9. She has had mastitis 1999.  10.She had TAH and BSO in 1979.  11.History of vein stripping in 1978.  12.She has also had prior cholecystectomy.  13.Breast biopsy in 1999 revealing 1.7 cm infiltrating lobular      carcinoma, status post right  mastectomy with sentinel node biopsy      that was negative, and a reconstructive flap.  14.She has a history of right arthroplasty.  15.History of ventral hernia repair.  16.She has been previously admitted to the hospital for chest pain and      again underwent previous intervention.   THE PATIENT'S ALLERGIES INCLUDE:  1. DEMEROL.  2. VALIUM.  3. AUGMENTIN.  4. CODEINE.   MEDICATIONS INCLUDE:  1. Multivitamin.  2. Neurontin.  3. Plavix 75 mg daily.  4. Calcium.  5. Avapro 150 mg daily.  6. Zantac 150 mg b.i.d.  7. Aspirin 81 mg daily.  8. Actonel 35 mg weekly.  9. Welchol 625 mg 6 tablets daily.  10.Fish oil 3 b.i.d.   FAMILY HISTORY:  Father died of 46 of cancer of the lung.  Mother died  at 6 of heart related problems.  She has a 60 year old brother who is  living.   SOCIAL HISTORY:  The patient is a  nonsmoker.  She worked as a Engineer, civil (consulting) at  [employer].  She has 5 children and is currently retired.  She  is widowed.   REVIEW OF SYSTEMS:  She has no eye or hearing problems, her thyroid is  negative.  Complete review of systems is entirely negative except as in  the HPI.  Of note, there is not a severe radiation of the discomfort.  There is no associated diaphoresis, nausea or vomiting.   EXAMINATION:  She is an alert, oriented female in no acute distress.  At  the completion of her procedure her blood pressure was 167/99 with a  pulse of 85, the jugular veins are not distended.  HEENT EXAMINATION:  Unremarkable.  There are no obvious carotid bruits.  LUNG FIELDS:  Clear to auscultation and percussion.  The PMI is  nondisplaced.  There is a normal first and second heart sound.  There  are no murmurs or obvious rubs noted.  ABDOMEN:  Was not examined today.  EXTREMITIES:  Reveal no significant edema.   The electrocardiogram today was done as part of the stress test.  No  acute changes are noted with this.  I do not see obvious perfusion  deficits.  The EKG  itself reveals normal rhythm.  There is some delay in  R-wave progression.  Peak stress there was no significant depression,  although she did have the pressure.  The perfusion deficits are without  significance and with an ejection fraction of 79%.   IMPRESSION:  1. Continued recurrent substernal chest pain with borderline abnormal      Cardiolite due to exercise-induced chest discomfort which was not      relieved.  2. Prior percutaneous stenting of the circumflex coronary artery.  3. Other problems as described in the past medical history.   PLAN:  1. Admission to the hospital.  2. Serial enzymes.  3. Heparin and nitroglycerin.   I have discussed this with the patient.  I am not convinced this is  ischemic, but she has had ongoing chest discomfort with no definite  etiology.  I think that she probably needs admission since her pain  continues to be ongoing.  We will allow Dr. Jens Som to decide on final  diagnostic and treatment plans.      Arturo Morton. Riley Kill, MD, [facility]  Electronically Signed     TDS/MEDQ  D:  06/14/2007  T:  06/15/2007  Job:  [id number]

## 2011-01-04 NOTE — Assessment & Plan Note (Signed)
[facility]                            CARDIOLOGY OFFICE NOTE   NAME:Attia, KERISHA GOUGHNOUR                   MRN:          [id number]  DATE:06/05/2007                            DOB:          1929/09/11    [name] returns for followup today.  She has a history of coronary  disease.  Her most recent catheterization was performed on April 14, 2004.  At that time, she was found to have disease in her circumflex,  and had a PCI at that time.  Her ejection fraction was 65%.  Her most  recent Myoview was performed on July 29, 2006.  At that time, there  was no sign of scar or ischemia, and her ejection fraction was 76%.  Since I last saw her, she states she does have some dyspnea (she states  people have noticed that she is breathing hard, but she does not notice  her dyspnea).  She also feels a heaviness.  This can occur either with  rest or exertion.  Note, she has also had problems with pain  predominantly in her right lower extremity.  She has seen Dr. Kellie Simmering,  and there is a question of whether this may be a back problem.  Note,  her pain increases more at night, and improves with exertion.   MEDICATIONS:  Include:  1. Multivitamin 1 p.o. daily.  2. Plavix 75 mg p.o. daily.  3. Calcium.  4. Avapro 150 mg p.o. daily.  5. Zantac 150 mg p.o. b.i.d.  6. Aspirin 81 mg p.o. daily.  7. Actonel 35 mg weekly.  8. Welchol.  9. Fish oil.   PHYSICAL EXAMINATION:  Shows a blood pressure of 127/82.  Her pulse is  102.  She weighs 147 pounds.  HEENT:  Normal.  NECK:  Supple.  CHEST:  Clear.  CARDIOVASCULAR EXAM:  Reveals a regular rate and rhythm.  ABDOMINAL EXAM:  Shows no tenderness.  EXTREMITIES:  Show no edema.  Her electrocardiogram shows a sinus rhythm at a rate of 101.  There is  left axis deviation.  There is poor R wave progression and prior  anterior infarct cannot be excluded.   DIAGNOSES:  1. Chest pain.  Some of her symptoms are  concerning, and others are      less typical.  We will plan to proceed with a Myoview for risk      stratification.  If this shows no ischemia, then we will continue      with medical therapy.  2. Coronary artery disease.  She will continue on her aspirin and      Plavix, as well as her Avapro.  Note, her statin was discontinued      recently, as it was felt it may be contributing to her lower      extremity pain.  However, her pain has persisted, and I therefore,      doubt this is the case.  She is now being followed in the Lipid      Clinic, and a statin can be considered in the future if she will  tolerate this.  3. Hypertension.  Her blood pressure is well controlled on her present      medications.  4. Hyperlipidemia.  She will be followed in the Lipid Clinic as      described above.  5. History of palpitations secondary to premature ventricular      contractions.  6. History of intolerance to beta blockade.  7. History of breast cancer.  8. History of depression.  9. History of irritable bowel syndrome.   We will see her back in 6 months if her Myoview is unremarkable.     Madolyn Frieze Jens Som, MD, [facility]  Electronically Signed    BSC/MedQ  DD: 06/05/2007  DT: 06/06/2007  Job #: [id number]

## 2011-01-04 NOTE — Assessment & Plan Note (Signed)
[facility]                               LIPID CLINIC NOTE   NAME:[name], [name]                   MRN:          [id number]  DATE:04/25/2008                            DOB:          10/01/1929    TIME OF PHONE CALL:  5:30.   HISTORY:  [name] was evaluated with labs in Lipid Clinic on April 16, 2008.  She returned from her trip to [place] today.  I spoke  with her at 5:30.  She has been feeling and doing well overall, taking  her Crestor 10 mg dose each Monday and Friday.  She has had MS-like  pains with some other problems.  I have gone over her labs today as well  as put a copy at the desk for her.  She has normal LFTs and LDL directly  measured at 409, triglycerides are slightly elevated in the 260s.  She  has agreed to increase to 10 mg of Crestor on Monday, Wednesday, and  Friday.  We will recheck in 3 months.  She will call with questions or  problems in the meantime.     Shelby Dubin, PharmD, BCPS, CPP  Electronically Signed    MP/MedQ  DD: 04/25/2008  DT: 04/26/2008  Job #: [id number]

## 2011-01-04 NOTE — Assessment & Plan Note (Signed)
[place] [facility]                            CARDIOLOGY OFFICE NOTE   NAME:[name], [name]                   MRN:          [id number]  DATE:07/03/2007                            DOB:          September 04, 1929    HISTORY:  [name] is a pleasant female who I follow for a history of  coronary disease.  She was recently complaining of chest pressure.  A  Myoview was performed on June 14, 2007 that showed normal perfusion  and an ejection fraction of 79%.  However, she continued to have chest  pressure at the time of her study and she was admitted to [facility] by Dr. Riley Kill.  She did rule out for myocardial infarction  with serial enzymes.  Her D-dimer was elevated, but a CT scan showed no  pulmonary embolus.  She ultimately underwent cardiac catheterization due  to persistent symptoms.  She was found to have no obstructive coronary  disease and preserved LV function.  Since then, she continues to have  occasional chest pressure.  She has been seen by her primary care doctor  and there is the possibility of a GI workup in the near future.  She  does not have shortness of breath.  There is no pedal edema.  She did  have some pain in her leg at the time of her catheterization, but this  has improved.  She was placed on steroid for a possible nerve  entrapment.   MEDICATIONS:  1. Multivitamin 1 p.o. daily.  2. Plavix 75 mg p.o. daily.  3. Calcium.  4. Avapro 150 mg p.o. daily.  5. Aspirin 81 mg p.o. daily.  6. Actonel 35 mg weekly.  7. Welchol.  8. Fish oil.  9. Toprol 25 mg p.o. daily.   PHYSICAL EXAMINATION:  VITAL SIGNS:  Blood pressure that is elevated at  145/95, on recheck it was 160/90, her pulse is 94, she weighs 146  pounds.  NECK:  Supple.  HEENT:  Normal.  CHEST:  Clear.  CARDIOVASCULAR:  Regular rate and rhythm.  Her right groin shows no  hematoma, no bruit.  EXTREMITIES:  Show no edema.   DIAGNOSES:  1. Persistent chest  pain.  Her CT and catheterization were      unremarkable.  She will most likely need a GI evaluation, but I      will leave this to her primary care physician.  2. Coronary artery disease.  She will continue on her aspirin, Plavix      and Avapro.  Her stain was recently discontinued as it was felt to      maybe contributing to her lower extremity pain.  However, this has      persisted and therefore I think it is unlikely.  She will be      followed in the lipid clinic and we can consider resuming her      Crestor in the near future.  [name] is following this for [place].  3. Hypertension.  Her blood pressure is elevated today, but states at      home it typically  runs in the 117/70 range.  I have asked her to      continue to track this and we will increase her medications if      indicated.  4. Hyperlipidemia, as per above.  5. History of palpitations secondary to premature ventricular      contractions.  She is now on Toprol and we will follow this.  She      has had some fatigue with beta blockers in the past, but thus far      she is tolerating Toprol.  6. History of breast cancer.  7. History of depression.  8. History of irritable bowel syndrome.   FOLLOW UP:  We will see her back in 6 months.     Mariah Frieze Jens Som, MD, [facility]  Electronically Signed    BSC/MedQ  DD: 07/03/2007  DT: 07/03/2007  Job #: [id number]

## 2011-01-04 NOTE — Assessment & Plan Note (Signed)
[place] [facility]                            CARDIOLOGY OFFICE NOTE   NAME:[name], [name]                   MRN:          [id number]  DATE:                                      DOB:          16-Nov-1929    [name] has been admitted to the hospital by me.  She had chest pain  with limited exercise tolerance but had a normal  myocardial perfusion  scan.  She has had a prior stent placed in the circumflex coronary  artery.  When she was admitted to the hospital, her cardiac enzymes were  negative.  She had a CT scan to rule out pulmonary embolus because of an  elevated D-dimer.  That was negative as well.  After review of the data,  Dr. Jens Som arranged for the patient to be discharged appropriately.  The patient expressed some concern, and her daughter was visibly upset.  I spoke with them for nearly an hour.  I promised that I would review  the chart, and I subsequently reviewed her chart and called Dr. Debby Bud.  I have also notified Dr. Jens Som.  In speaking with the patient, she  has continued to have rather significant exhaustion, and some chest  discomfort.  This has been going on for some time, but she is quite  concerned about.  The CT is negative.  Perfusion scans are normal, but  her stent was previously placed in the distribution of the circumflex.  She has seen Dr. Debby Bud for a variety of problems, including headache,  myalgias, insomnia, and she has had prior stenting as well.  She has  also had a history of gastroesophageal reflux.   Her medical problems include hypertension, hyperlipidemia, positive  family history.  She has had peptic ulcer disease with gastritis,  diverticulitis, bursitis of the right shoulder, superficial phlebitis,  gastroparesis, prior TAH and BSO, history of vein stripping in 1978,  history of biopsy of the breast in 1999, with a sentinel node biopsy  that was negative and reconstruction TRAM flap after right  mastectomy,  right arthroplasty and history of ventral hernia repair.  The stent in  the mid-circumflex was a Cypher 2.5 x 18 drug-eluting platform.  As a  result, the patient has remained on medications including aspirin and  Plavix.   CURRENT MEDICATIONS:  Include:  1. Multivitamin.  2. Plavix 75 mg daily.  3. Calcium.  4. Avapro 150 mg daily.  5. Zantac 150 mg b.i.d.  6. Aspirin 81 mg daily.  7. Actonel 35 mg weekly.  8. Welchol 6 daily.  9. Fish oil, three b.i.d.   PHYSICAL EXAMINATION:  Please see my recent note for her physical  examination.   I have discussed the case with her in detail.  I have discussed with Dr.  Debby Bud, who felt that she would probably be best served with a cardiac  catheterization, given all the uncertainty.  If that were to be  negative, then we would move on with the evaluation.  We will go ahead  and get her  laboratory studies done,  and we will set her up for an outpatient joint  venture cath lab.  I discussed this with her on the phone today, and she  is agreeable to proceed.     Arturo Morton. Riley Kill, MD, [facility]  Electronically Signed    TDS/MedQ  DD: 06/18/2007  DT: 06/18/2007  Job #: [id number]   cc:   Rosalyn Gess. Norins, MD

## 2011-01-04 NOTE — Consult Note (Signed)
[name], [name]            ACCOUNT NO.:  [id number]   MEDICAL RECORD NO.:  [id number]          PATIENT TYPE:  INP   LOCATION:  2005                         FACILITY:  [facility]   PHYSICIAN:  Gustavus Messing. Orlin Hilding, M.D.DATE OF BIRTH:  01/28/1930   DATE OF CONSULTATION:  06/21/2007  DATE OF DISCHARGE:  06/22/2007                                 CONSULTATION   NEUROLOGY CONSULTATION:   CHIEF COMPLAINT:  Right side pain and weakness.   HISTORY OF PRESENT ILLNESS:  [name] is a 78 year old white woman  with a history of hypertension and coronary artery disease, who is  status post singe-vessel stent two years ago.  She has been having some  chest pain and had an unrevealing outpatient workup so she was brought  in today for an outpatient cardiac catheterization by Dr. Riley Kill; this  was largely unremarkable.  Shortly after the catheterization and sheath  removal, patient complained of excruciating pain at her right hip to  knee and the thigh anteriorly with inability to flex the leg at the hip  or extend the knee.  Almost any motion of right lower extremity  proximally exacerbates the pain.  She denies any numbness.   REVIEW OF SYSTEMS:  Negative for headaches, facial or upper extremity  symptoms, no symptoms in the left lower extremity.  She has had some low  back pain with a recent MRI of the lumbar spine, though not having the  current symptoms at that time.   PAST MEDICAL HISTORY:  Significant for:  1. Coronary artery disease status post one-vessel stent.  2. Hypertension.  3. Hyperlipidemia.  4. Peripheral vascular disease.  5. Superficial phlebitis with vein stripping.  6. Irritable bowel syndrome.  7. Diverticulitis.  8. Gastritis.  9. Gastroparesis.  10.Bursitis.  11.Mastitis.  12.History of pneumonia.  13.History of cholecystectomy, total abdominohysterectomy, bilateral      salpingo-oophorectomy and right arthroplasty.   MEDICATIONS:  1. Avapro.  2.  Plavix.  3. Aspirin.  4. Zantac.  5. Darvocet.   ALLERGIES:  DEMEROL, CODEINE, VALIUM AND AUGMENTIN.   SOCIAL HISTORY:  She is widowed.  She does have children.  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   FAMILY HISTORY:  Positive for lung cancer and heart disease.   PHYSICAL EXAMINATION:  VITAL SIGNS:  Pulse is 79, BP 135/90, 98% sat on  room air.  HEAD:  Normocephalic, atraumatic.  MENTAL STATUS:  She is awake, alert and appropriate with normal  language.  Cranial nerves:  Visual fields are full.  Extraocular  movements are intact.  Facial sensation is normal.  Facial motor is  normal.  Hearing is intact.  Palate is symmetric and tongue is midline.  On motor exam, in the upper extremities there is no drift.  She had  normal bulk, tone and strength throughout both upper extremities.  In  the left lower extremity, there is no drift.  She has normal bulk, tone  and strength.  In the right lower extremity, there is 5/5 dorsiflexion  and plantar flexion and hamstring, however she is unable to or unwilling  to  flex the hip at the groin and extend the knee because of pain and  possibly weakness.  She also cannot adduct or abduct without pain.  I;  therefore, really cannot get a very good feeling with the exception of  the psoas for how some of the proximal muscles are specifically psoas  and quadriceps.  Deep tendon reflexes, however, are 2+ and symmetric in  the ankles and knees with downgoing toes.  Coordination:  I am not able  to assess the right lower extremity because of pain and weakness.  Sensory exam is without any sensory loss to the level of  a pinprick.   CT of the abdomen and pelvis is largely unremarkable; there is no psoas  or retroperitoneal hematoma.   IMPRESSION:  Severe acute right leg pain in the anterior thigh from hip  to knee with possible weakness in hip flexion and knee extension.  It is  hard to assess the motor exam, however, because of the pain with   movement.  This may be consistent with a right femoral neuropathy or L3-  4 nerve root injury.   RECOMMENDATIONS:  1. Pain control.  2. MRI of the lumbar spine and right lower extremity.  3. She might need a physical therapy evaluation.  4. If the symptoms are not quickly resolving, nerve conduction EMG of      the right lower extremity in a few weeks may help clarify the      injury.      Catherine A. Orlin Hilding, M.D.  Electronically Signed     CAW/MEDQ  D:  06/21/2007  T:  06/22/2007  Job:  [id number]

## 2011-01-04 NOTE — Discharge Summary (Signed)
[name], [name]            ACCOUNT NO.:  [id number]   MEDICAL RECORD NO.:  [id number]          PATIENT TYPE:  INP   LOCATION:  4709                         FACILITY:  [facility]   PHYSICIAN:  Mariah Frieze. Jens Som, MD, FACCDATE OF BIRTH:  [birth year]   DATE OF ADMISSION:  06/14/2007  DATE OF DISCHARGE:  06/15/2007                               DISCHARGE SUMMARY   PRIMARY CARDIOLOGIST:  Dr. Olga [name].   PRIMARY CARE [name]:  Dr. Illene [name].   DISCHARGE DIAGNOSIS:  Chest pain.   SECONDARY DIAGNOSES:  1. Hypertension.  2. Hyperlipidemia.  3. Coronary artery disease.  4. Peptic ulcer disease.  5. Bowel syndrome.  6. Diverticulitis.  7. History of superficial phlebitis.  8. History of pneumonia.  9. History of mastitis in 1999.  10.Status post TAH-BSO.  11.Status post cholecystectomy.  12.Status post vein stripping  13.History of gastroparesis.   ALLERGIES:  CODEINE, DEMEROL, VALIUM, AUGMENTIN.   PROCEDURES:  CT of the chest.   HISTORY OF PRESENT ILLNESS:  This 77 year old Caucasian female with  prior history of CAD whose last catheterization was August 2005 at which  time she underwent PCI and a Cypher drug-eluting stent placement of the  left circumflex.  More recently, she has had intermittent chest  discomfort, and for that reason she underwent a Myoview stress testing  on October 23 which showed no evidence of ischemia or infarct.  Unfortunately, she continued to have chest discomfort following her  stress test.  Was seen by Dr. Riley [name] in the office with decision to  admit her to [facility] for observation and rule her out.   HOSPITAL COURSE:  [name] is ruled out for MI.  A D-dimer was  performed was elevated 1.08.  As a result, CT of the chest was performed  that showed no acute findings and was negative for pulmonary embolism.  There were large calcified mediastinal nodes consistent with old  granulomatous disease.  Tentative plan is for discharge  today with  outpatient GI follow-up to be arranged by Dr. Debby [name] at [name]'s  request.   DISCHARGE LABORATORIES:  Hemoglobin 12.6, hematocrit 37.4, WBC 5.1,  platelets 311, INR 1.1.  Sodium 136, potassium 3.9, chloride 103, CO2  28, BUN 12, creatinine 0.3, glucose 122, total bilirubin 0.4, alkaline  phosphatase 55, AST 22, ALT 21, total protein 7.0, albumin 3.7, calcium  9.1, CK 190, MB 1.7, troponin I 0.02.   DISPOSITION:  Patient is being discharged home today in good condition.   FOLLOW-UP APPOINTMENTS:  She is to follow up with [name] on  January 7 at 10:45 a.m.Marland Kitchen  She is to follow up with Dr. Debby [name] in the  next 1-2 weeks.   DISCHARGE MEDICATIONS:  1. Avapro 150 mg daily.  2. Plavix 75 mg daily.  3. Actonel 35 mg q. week.  4. Aspirin 81 mg daily.  5. Welchol 725 mg 3 tablets b.i.d.  6. Omega-3 fatty acid daily.  7. Multivitamin one daily.  8. Zantac 150 mg b.i.d.  9. Nitroglycerin 0.4 mg sublingual p.r.n. chest pain.   OUTSTANDING LABORATORY STUDIES:  None.   DURATION  DISCHARGE ENCOUNTER:  Sixty minutes including physician time.      [name], [name]      Mariah Frieze. Jens Som, MD, [facility]  Electronically Signed    CB/MEDQ  D:  06/15/2007  T:  06/17/2007  Job:  [id number]   cc:   Mariah Gess. Norins, MD

## 2011-01-04 NOTE — Cardiovascular Report (Signed)
[name], [name]            ACCOUNT NO.:  [id number]   MEDICAL RECORD NO.:  [id number]          PATIENT TYPE:  INP   LOCATION:  2005                         FACILITY:  [facility]   PHYSICIAN:  Arturo Morton. Riley Kill, MD, FACCDATE OF BIRTH:  06-17-1930   DATE OF PROCEDURE:  06/21/2007  DATE OF DISCHARGE:                            CARDIAC CATHETERIZATION   PROCEDURES PERFORMED:  1. Left heart catheterization.  2. Selective coronary arteriography.  3. Selective left ventriculography.   CARDIOLOGIST:  Arturo Morton. Riley Kill, M.D., [facility]   INDICATIONS:  The patient is a very pleasant 77 year old woman who  presented with some continued chest pain.  She had radionuclide imaging,  which revealed a normal perfusion study, but she had continued chest  pain before, during and after.  She was moderately hypertensive through  all of this.  She was admitted to the hospital and enzymes were  negative.  A CT scan revealed calcification of the nodes, but no  perfusion defect and no evidence of aortic dissection.  Because of  concern over continued symptoms cardiac catheterization was recommended.  This was discussed with the patient by phone in detail and she agreed to  proceed.   DESCRIPTION OF THE PROCEDURE:  The patient was brought to the cath lab,  prepped and draped in the usual fashion.  Through an anterior puncture  the right femoral artery was easily entered and a 4 French sheath was  placed.  Views of the left and right coronary arteries were obtained.  Central aortic and left ventricular pressures were measured with a  pigtail.  Ventriculography was performed in the RAO projection.   The patient was hypertensive throughout the case and did receive two  doses of metoprolol at 5 mg and 2.5 mg.  She was tachycardiac to begin  with, but her heart rate did come down into the 80s.   COMPLICATIONS:  There were no major complications.   DISPOSITION:  The patient was taken to the holding area in  satisfactory  clinical condition.   HEMODYNAMIC DATA.:  1. Central aortic pressure 191/88, mean 134.  2. Left atrial pressure 199/22.  3. No gradient pullback across aortic valve angiographic data.   ANGIOGRAPHIC DATA:  1. Ventriculography:  The ventriculography in the RAO projection      revealed preserved global systolic function without segmental wall      motion abnormality.   1. Right Coronary Artery:  The right coronary artery demonstrates mild      luminal irregularity.  No areas of high-grade focal obstruction      were noted.  The PDA and posterolateral system were both moderately      large in size.  No critical stenosis was noted.   1. Left Main:  The left main is free critical disease.   1. Left Anterior Descending:  The left anterior descending artery      courses to the apex.  A septal and diagonal come off at the same      location; and, just beyond this there is about 30% eccentric      plaquing.  The distal vessel is without  critical narrowing.   1. Circumflex:  The circumflex provides a ramus intermedius and is      somewhat tortuous.  There is minor luminal irregularity.  The AV      circumflex has been previously stented.  There was 30-40% segmental      plaquing proximally and then a stent in the midvessel, which      demonstrated no restenosis whatsoever.   CONCLUSION:  1. Preserved left ventricular function.  2. Continued patency of the stent placed in the circumflex coronary      artery previously.  3. Scattered mild irregularities as noted above.   PLAN:  The patient will follow up with Dr. Debby Bud.  Dr. Riley Kill will  also see him.   ADDENDUM:  Toprol XL 25 may be given.      Arturo Morton. Riley Kill, MD, [facility]  Electronically Signed     TDS/MEDQ  D:  06/21/2007  T:  06/21/2007  Job:  [id number]   cc:   Madolyn Frieze. Jens Som, MD, [facility]  Rosalyn Gess. Norins, MD  Cardiovascular Laboratory

## 2011-01-04 NOTE — Assessment & Plan Note (Signed)
[facility]                           PRIMARY CARE OFFICE NOTE   NAME:Packham, RENEA SCHOONMAKER                   MRN:          [id number]  DATE:01/18/2007                            DOB:          06-10-1930    Mrs. Issa is a 77 year old woman who presents today after returning  from her trip to [place] with multiple complaints:  1. Paraesthesia. The patient reports that she has had kind of a      numbness/tingling sensation at the ball of her foot rising up      higher to her leg which is occurring now bilaterally. There is no      burning dysesthesia. This is not incapacitating, but bothersome      only.  2. Myalgias. The patient reports that she has waxing and waning muscle      pain and discomfort. She has had a problem with statin drugs in the      past and recently stopped taking Lipitor for this reason with some      improvement in her discomfort. She previously had a similar problem      with Vytorin. She also reports that she has significant tenderness      just below her right knee.  3. Headache. The patient describes a frontal headache running from one      temple to the other. She describes this as a tight band and      squeezing type of sensation. She has some photosensitivity and mild      nausea. She denies any visual changes. She has no facial numbness.      She has had no cognitive impairment.  4. Fatigue. The patient reports that she is exhausted and feels that      this is disproportionate to her level of activity, including her      recent travel.  5. Shortness of breath with cough.   PAST MEDICAL HISTORY:  SURGICAL:  1. TAH/BSO.  2. Vein stripping.  3. Laparoscopic cholecystectomy in 1992.  4. Breast biopsy with right mastectomy in 1999 with a TRAM flap      reconstruction.  5. Arthroplasty of her right thumb in 2002.  6. Ventral hernia repair in 2002.   MEDICAL ILLNESSES:  1. IBS.  2. Diverticulosis with an episode of  diverticulitis in the past.  3. Peptic ulcer disease.  4. Degenerative joint disease, cervical spine.  5. Breast cancer, invasive or lobular in nature.  6. Right lower lobe BOOP.   CURRENT MEDICATIONS:  1. Multivitamin daily.  2. Plavix 75 mg daily.  3. Calcium.  4. Avapro 150 mg daily.  5. Zantac 150 mg b.i.d.  6. Aspirin 81 mg daily.  7. Actonel 35 mg weekly.  8. Zyrtec p.r.n. basis.   REVIEW OF SYSTEMS:  The patient has had no fevers or chills. She has had  no ophthalm complaints. She has had no chest pain, chest discomfort, or  palpitations. There are no GI complaints.   PHYSICAL EXAMINATION:  VITAL SIGNS:  Temperature 97.8, blood pressure  135/90, pulse 100, weight 148.6.  GENERAL:  This is  a well developed, well nourished, well groomed woman  looking younger than her stated age in no acute distress.  HEENT:  The patient had no palpable temporal artery or specific tender  vessel, although she has general tenderness in the temporal region. EACs  were clear.  CHEST:  The patient is moving air well. There are no rales, wheezes, or  rhonchi.  CARDIOVASCULAR:  2+ radial pulse, she had a quiet precordium with a  regular rate and rhythm.  MUSCULOSKELETAL:  The patient had good range of motion at all of her  major joints and has normal ambulation. She had mild muscle tenderness  to palpation along her lower extremity, particularly on the right leg.  NEUROLOGIC:  Revealed the patient to have normal light, touch, and  pinprick sensation.   DATABASE:  TSH was normal at 3.40. B12 was normal at 670. Sedimentation  rate was mildly elevated at 40. Total CK was elevated at 301. CBC was  normal.   ASSESSMENT AND PLAN:  1. Myalgias. The patient with significant muscle discomfort. She has a      mildly elevated sedimentation rate and a mildly elevated creatinine      kinase at approximately twice normal. The patient has been off of      statins for at least a couple of weeks. This may  represent a mild      myositis. Plan: A short course of prednisone starting at 20 mg      daily for 1 week, 10 mg daily for one week, and then reassess in      terms of ongoing steroid use.  2. Paresthesias. The patient has normal B12 and TSH. The origin of her      paraesthesia is at this time unknown and may represent etiopathic      neuropathy. No intervention is needed at this point given that she      is not having significant burning dysesthesias, or discomfort that      is limiting her activities.  3. Frontal headache. I would be concerned for possible temporal      arteritis. We will gauge the patient's response to non-steroidal      antiinflammatory drugs in regards to her headache and also to see      what her response is in regards to steroids as noted.  4. Generalized fatigue. The patient with elevated CK and mildly      sedimentation rate. It is possible that her fatigue is related to      her myositis. We will see about improvement on steroids over the      short term.  5. Dyspnea. The patient did have a chest x-ray at today's visit which      showed no acute cardiopulmonary findings and a stable chest since      last examination on 11/29/05 with mild changes of chronic      obstructive pulmonary disease which are age related.   In summary, the patient may have unifying diagnosis of mild myositis  explaining her myalgias, headache, and fatigue. We will reassess her  after steroid burst and taper as noted.     Rosalyn Gess Norins, MD  Electronically Signed    MEN/MedQ  DD: 01/19/2007  DT: 01/19/2007  Job #: [id number]   cc:   Wonda Cerise

## 2011-01-04 NOTE — Assessment & Plan Note (Signed)
[facility]                           PRIMARY CARE OFFICE NOTE   NAME:Addo, LARITA DEREMER                   MRN:          [id number]  DATE:01/30/2007                            DOB:          [birth year]    Ms. Fennewald is a delightful 77 year old woman well known to me in the  practice, followed for multiple medical problems including irritable  bowel syndrome, diverticulosis, peptic ulcer disease, history of breast  cancer with invasive lobular type, history of right lower lobe BOOP  resolved in November 2005.   Patient was seen Jan 18, 2007 complaining of generalized myalgias, which  wax and wane, which she associated statin medications.  She also is  complaining of frontal headache.  In the interval since her visit,  patient reports that she has been able to manage her headache with the  use of limited doses of nonsteroidal antiinflammatory drugs.  The  patient did have laboratories drawn, which revealed the patient to have  an elevated CK at 301.  My concern was the patient had myositis, either  as the result of exposure to statin medications, or possibly  rheumatologic disorder.  She has already stopped medications.  She was  prescribed a prednisone burst and taper at 30 mg daily x5, then 20 mg  daily x5, then 10 mg x5, and 5 mg daily x4.  Patient reports she has  seen marked improvement in her symptoms with decreased leg pain and body  aches.  She is resting better.   Patient complains of mild insomnia, and is requiring about the safety in  use of melatonin.   VITAL SIGNS:  Temperature was 98.2.  Blood pressure 135/84.  Pulse 83.  Weight 144.  GENERAL APPEARANCE:  A well-nourished, well-groomed woman, in no  distress.   CURRENT MEDICATIONS:  1. Plavix 75 mg daily.  2. Calcium.  3. Avapro 150 mg daily.  4. Zantac 150 mg b.i.d.  5. Aspirin 81 mg daily.  6. Actanol 35 mg weekly.   ASSESSMENT AND PLAN:  1. Headache.  Patient with muscle  tension-type headache.  She may      continue to use Aleve on an as-needed basis.  2. Myalgias.  I suspect the patient has had a drug reaction to statin      medication with diffuse muscle aches and discomfort with elevated      CK.  3. Insomnia.  I have encouraged the patient to continue to use Tylenol      as needed.  She may use melatonin with great safety.  If she      continues to have trouble sleeping, would be happy to trial her      through other hypnotics that are available.   PLAN:  Patient to complete a prednisone taper.  One week to 10 days  after completing prednisone she is to return for repeat lipid profile  and a CK.  Patient is asked to return to see me on a p.r.n. basis.     Rosalyn Gess Norins, MD  Electronically Signed    MEN/MedQ  DD: 01/31/2007  DT: 01/31/2007  Job #: [id number]   cc:   Madolyn Frieze. Jens Som, MD, [facility]  Wonda Cerise

## 2011-01-04 NOTE — Assessment & Plan Note (Signed)
[facility]                            CARDIOLOGY OFFICE NOTE   [name], [name]                   MRN:          [id number]  DATE:01/30/2008                            DOB:          [birth year]    [name] returns for followup today.  She does have a history of  coronary artery disease with prior drug-eluting stent to the left  circumflex in 2005.  Back in the fall, she was having episodes of chest  pain.  At that time, a Myoview was performed on June 14, 2007.  Her  ejection fraction was normal at 79% and there was no scar or ischemia.  She also had a CT that showed no pulmonary embolus and ultimately  underwent cardiac catheterization.  This was performed on June 21, 2007.  Her LV function was normal.  She had minor plaquing in her  coronaries, but there was no other obstructive disease noted.  Since  then, she has done reasonably well.  She does have some dyspnea on  exertion, which has been a chronic issue.  However, there is no  orthopnea, PND, or pedal edema.  She continues to have occasional chest  tightness, that is not clearly exertional.  It is unchanged from  previous.   MEDICATIONS:  1. Multivitamin daily.  2. Plavix 75 mg p.o. daily.  3. Calcium 600 mg p.o. daily.  4. Avapro 150 mg p.o. daily.  5. Aspirin 81 mg p.o. daily.  6. Fish oil 3 b.i.d.  7. Crestor 10 mg on Mondays and Fridays.   PHYSICAL EXAMINATION:  Today, her blood pressure is 128/79 and her pulse  is 96.  She weighs 139 pounds.  HEENT:  Normal.  NECK:  Supple.  No bruits.  CHEST:  Clear.  CARDIOVASCULAR:  Regular rate and rhythm.  ABDOMEN:  No tenderness.  EXTREMITIES:  No edema.   Her electrocardiogram shows a sinus rhythm at a rate of 88.  There is  left axis deviation as well as low voltage.  Prior septal infarct cannot  be excluded.   DIAGNOSES:  1. Coronary artery disease - [name] continues to have chest      tightness that is unchanged.   Of note, her catheterization in the      fall showed nonobstructive disease and her Myoview was normal.  We      will not pursue this further at this point.  She will continue on      her aspirin, her statin, and her Avapro.  She would like to      discontinue her Plavix.  I have explained that there is a very      small risk of stent thrombosis in patients with drug-eluting stents      placed, but I do think this is small given that her stent was      placed 4 years ago.  She would like to discontinue her Plavix, and      we will do this and continue her aspirin.  She will notify us if      there is any change in  her symptoms.  2. Hypertension - her blood pressure is adequately controlled on her      present medications.  3. Chest pain - again, this is unchanged and her previous      catheterization showed nonobstructive disease.  4. Hyperlipidemia - she will continue on her Crestor, and this is      being managed by [name].  5. History of palpitations.  6. History of breast cancer.  7. History of depression.  8. History of irritable bowel syndrome.   The patient will also need followup carotid Dopplers in October 2009.   I will see her back in 9 months.     Mariah Frieze Jens Som, MD, [facility]  Electronically Signed    BSC/MedQ  DD: 01/30/2008  DT: 01/31/2008  Job #: [id number]

## 2011-01-04 NOTE — Assessment & Plan Note (Signed)
[facility]                               LIPID CLINIC NOTE   NAME:[name], [name]                   MRN:          [id number]  DATE:06/08/2007                            DOB:          10-10-1929    FOLLOWUP TELEPHONE DOCUMENTATION:  Ms. Campise was re-contacted from the  Lipid Clinic to find out how she tolerated her Welchol, 6 tablets daily.  As it turns out she states that she has tolerated it without issue or  problem.  She has had no trouble taking the medication and really feels  well overall.  The patient states that she will be taking any additional  samples that are available.  Unfortunately after checking samples  closet, I do not have samples of this medication for her use.  After  discussing cost of Welchol 6 tablets daily, the patient would instead  prefer to attempt therapy with cholestyramine 1 scoop daily with liquid  to address her cholesterol side effects including constipation have been  discussed with the patient.  She will contact us within 3 weeks to let  us know how she is doing and she will have labs checked in 3-6 weeks.      Shelby Dubin, PharmD, BCPS, CPP  Electronically Signed      Rollene Rotunda, MD, [facility]  Electronically Signed   MP/MedQ  DD: 06/08/2007  DT: 06/10/2007  Job #: [id number]   cc:   Lonzo Cloud. Kriste Basque, MD

## 2011-01-04 NOTE — Discharge Summary (Signed)
[name], [name]            ACCOUNT NO.:  [id number]   MEDICAL RECORD NO.:  [id number]          PATIENT TYPE:  INP   LOCATION:  2005                         FACILITY:  [facility]   PHYSICIAN:  Arturo Morton. Riley Kill, MD, FACCDATE OF BIRTH:  1929/12/19   DATE OF ADMISSION:  06/21/2007  DATE OF DISCHARGE:  06/22/2007                               DISCHARGE SUMMARY   PROCEDURES:  1. Cardiac catheterization.  2. Coronary arteriogram.  3. Left ventriculogram.  4. CT of the abdomen and pelvis.   PRIMARY FINAL DISCHARGE DIAGNOSIS:  Right lower extremity numbness and  weakness.   SECONDARY DIAGNOSES:  1. Chest pain status post cath this admission without critical      disease.  2. Hyperlipidemia.  3. Hypertension.  4. Remote history of pneumonia.  5. History of mastitis.  6. Status post cholecystectomy, vein stripping, TAH (total abdominal      hysterectomy) and BSO (bilateral salpingo-oophorectomy).  7. History of superficial phlebitis.  8. Diverticulitis.  9. Peripheral vascular disease.  10.Irritable bowel syndrome.   Time at discharge 41 minutes.   HOSPITAL COURSE:  [name] is a 77 year old female with known coronary  artery disease.  She had a stent to her circumflex in August of 2005.  She was having some chest pain and came to the hospital for an  outpatient cath which was performed on June 21, 2007.   The cardiac catheterization showed zero in-stent restenosis.  There was  nonobstructive disease between 30-40% in the LAD and circumflex and the  RCA system was without disease.  Her left ventriculogram was normal.   Post procedure she developed pain in the right groin.  There was no  hematoma and the site itself looked good.  She did have some movement.  A CT of the abdomen and pelvis was performed which showed no evidence of  bleed.  She was admitted and neurology was called.   She was [name] by Dr. Orlin Hilding who recommended pain control and an MRI.  [name] was not  able to do the MRI because of claustrophobia.  If the  MRI is desired one can be scheduled in the open MRI as an outpatient.   Ms. Laskin was [name] by physical therapy on June 22, 2007.  Her  symptoms had resolved and she was at baseline, independent with all  mobility.  Dr. Riley Kill examined [name] and felt that she had good  hip flexor motion with no hematoma or bruit at her cath site.  Dr.  Riley Kill raised the possibility that she had femoral nerve palsy  secondary to Xylocaine which had resolved.  Low dose Toprol was added to  her medication regimen for better blood pressure control and she was  considered stable for discharge with outpatient followup arranged.   DISCHARGE INSTRUCTIONS:  Her activity level is to be increased  gradually.  She is not to drive for 2 days and not to lift anything for  a week.  She is to call our office for problems with the cath site.  She  is to stick to a low sodium heart healthy diet.  She is to follow up  with Dr. Jens Som November 11 at 10:45 and with Dr. Debby Bud as well for a  noncardiac cause of the chest pain.   DISCHARGE MEDICATIONS:  1. Vitamins daily.  2. Plavix 75 mg daily.  3. Calcium daily.  4. Avapro 150 mg daily.  5. Zantac 150 mg b.i.d.  6. Aspirin 81 mg a day.  7. Actonel 35 mg q. week.  8. Zoloft 50 mg 1/2 tablet daily.  9. Welchol 625 mg two tabs t.i.d.  10.Fish oil 3 tablets b.i.d.  11.To take Claritin and albuterol as needed.  12.Toprol XL 50 mg 1/2 tablet daily.      Theodore Demark, PA-C      Arturo Morton. Riley Kill, MD, [facility]  Electronically Signed    RB/MEDQ  D:  06/22/2007  T:  06/22/2007  Job:  [id number]   cc:   Rosalyn Gess. Norins, MD

## 2011-01-04 NOTE — Progress Notes (Signed)
  Subjective:    Patient ID: [name], female    DOB: 1929/10/03, 77 y.o.   MRN: [id number]  HPI [name] is concerned for a change in cognitive ability. She has found that she has forgotten recitations that she knows well, e.g. Prayers at mealtime and other occassions; she has difficulty recalling items, e.g. Actors in her favorite films. She is very concerned for cognitive decline. She has had no focal neurologic symptoms.   I have reviewed the patient's medical history in detail and updated the computerized patient record.    Review of Systems Review of Systems  Constitutional:  Negative for fever, chills, activity change and unexpected weight change.  HENT:  Negative for hearing loss, ear pain, congestion, neck stiffness and postnasal drip.   Eyes: Negative for pain, discharge and visual disturbance.  Respiratory: Negative for chest tightness and wheezing.   Cardiovascular: Negative for chest pain and palpitations.       [No decreased exercise tolerance Gastrointestinal: [No change in bowel habit. No bloating or gas. No reflux or indigestion Genitourinary: Negative for urgency, frequency, flank pain and difficulty urinating.  Musculoskeletal: Negative for myalgias, back pain, arthralgias and gait problem.  Neurological: Negative for dizziness, tremors, weakness and headaches.  Hematological: Negative for adenopathy.  Psychiatric/Behavioral: Negative for behavioral problems and dysphoric mood.       Objective:   Physical Exam WNWD well groomed white woman in no distress HEENT - no abnl noted Cor - RRR Neuro - A&O x 3, CN II-XII grossly normal, nl gait.  MMSE: 1. Day,date,year - OK fluid 2. Content: president- ok   Gov. -   Bev Current events - rapid recall. 3. Number repitition: 5 fwd -  ok  5 rev -ok       World reversed - 1 error 4. 3 word recall - 5. Serial 7's - ok    , nickles in $1.25- ok       Change making - ok 6. Naming objects -    Ok-      4 legged  creatures -ok 7. Parables:  Glass [place] - ok      Rolling stone - ok 8. Judgement:  Letter ok         Fire ok 9. Clock face exercise - fluid and normal     Lab - normal chemistry except for minimally elevated blood sugar (not fasting). Thyroid functions are normal. B12 pending.    Assessment & Plan:  1. Memory loss - very alert and oriented woman with good performance in MMSE.  Plan - reassurance that there is no objective evidence of dementia           Continue to remain active mentally

## 2011-01-05 ENCOUNTER — Telehealth: Payer: Self-pay | Admitting: Internal Medicine

## 2011-01-05 NOTE — Telephone Encounter (Signed)
Please call pt. B12 and thyroid functions are normal.

## 2011-01-05 NOTE — Telephone Encounter (Signed)
Left mess to call office back.   

## 2011-01-06 NOTE — Telephone Encounter (Signed)
Patient notified per MD.

## 2011-01-07 NOTE — Op Note (Signed)
NAME:  [name], [name]                      ACCOUNT NO.:  [id number]   MEDICAL RECORD NO.:  [id number]                   PATIENT TYPE:  AMB   LOCATION:  CARD                                 FACILITY:  [facility]   PHYSICIAN:  Shan Levans, M.D. [facility]            DATE OF BIRTH:  07-03-1930   DATE OF PROCEDURE:  05/06/2004  DATE OF DISCHARGE:                                 OPERATIVE REPORT   PROCEDURE:  Bronchoscopy.   INDICATIONS FOR PROCEDURE:  Left lower lobe infiltrate and spiculated  lesion, evaluate for malignancy versus organized pneumonia versus both.   OPERATOR:  Charlcie Cradle. Delford Field, M.D. [facility]   ANESTHESIA:  1% Xylocaine local.   PREOPERATIVE MEDICATION:  Versed 3 mg IV push, Fentanyl 50 mcg IV push.   PROCEDURE:  The Pentax video bronchoscope was introduced through the right  nares.  The upper airways were visualized and were entirely unremarkable.  The entire tracheobronchial tree was visualized and revealed diffuse  tracheobronchitis, purulence seen emanating from the left lower lobe  orifice.  No endobronchial lesions were seen.  Transbronchial biopsies x 5  were taken of the left lower lobe.  Bronchial washings were obtained.   COMPLICATIONS:  None.   IMPRESSION:  Left lower lobe infiltrate, rule out organized pneumonia versus  malignancy versus both.   RECOMMENDATIONS:  Follow up pathology and microbiology.                                               Shan Levans, M.D. [facility]    PW/MEDQ  D:  05/06/2004  T:  05/06/2004  Job:  [id number]   cc:   Rosalyn Gess. Norins, M.D. [facility]

## 2011-01-14 ENCOUNTER — Telehealth: Payer: Self-pay | Admitting: *Deleted

## 2011-01-14 NOTE — Telephone Encounter (Signed)
Message copied by Carin Primrose on Fri Jan 14, 2011 11:48 AM ------      Message from: Cristy Hilts F      Created: Thu Jan 13, 2011 12:58 PM                   ----- Message -----         From: Minus Breeding, MD         Sent: 01/04/2011   6:33 PM           To: Brenton Grills            please leave message on phone tree--normal

## 2011-01-14 NOTE — Telephone Encounter (Signed)
Pt informed of lab results. 

## 2011-01-14 NOTE — Telephone Encounter (Signed)
Left message for pt to callback to inform pt about lab results

## 2011-01-21 ENCOUNTER — Other Ambulatory Visit: Payer: Self-pay | Admitting: *Deleted

## 2011-01-21 MED ORDER — LISINOPRIL 20 MG PO TABS: 20.0000 mg | ORAL_TABLET | Freq: Every day | ORAL | Status: DC

## 2011-02-11 ENCOUNTER — Other Ambulatory Visit: Payer: Self-pay | Admitting: Internal Medicine

## 2011-03-02 ENCOUNTER — Encounter: Payer: Self-pay | Admitting: Cardiology

## 2011-04-07 ENCOUNTER — Encounter: Payer: Self-pay | Admitting: Cardiology

## 2011-04-07 ENCOUNTER — Ambulatory Visit (INDEPENDENT_AMBULATORY_CARE_PROVIDER_SITE_OTHER): Payer: Medicare Other | Admitting: Cardiology

## 2011-04-07 DIAGNOSIS — R002 Palpitations: Secondary | ICD-10-CM

## 2011-04-07 DIAGNOSIS — E785 Hyperlipidemia, unspecified: Secondary | ICD-10-CM

## 2011-04-07 DIAGNOSIS — I2510 Atherosclerotic heart disease of native coronary artery without angina pectoris: Secondary | ICD-10-CM

## 2011-04-07 DIAGNOSIS — I10 Essential (primary) hypertension: Secondary | ICD-10-CM

## 2011-04-07 DIAGNOSIS — I6529 Occlusion and stenosis of unspecified carotid artery: Secondary | ICD-10-CM

## 2011-04-07 NOTE — Assessment & Plan Note (Signed)
Continue aspirin and statin. Followup carotid Dopplers. 

## 2011-04-07 NOTE — Assessment & Plan Note (Signed)
Blood pressure controlled. Continue present medications. 

## 2011-04-07 NOTE — Progress Notes (Signed)
HPI: Pleasant female I have seen previously for coronary disease. She does have a history of drug-eluting stent to the circumflex in 2005. Her last Myoview in March 2011 was normal with no scar or ischemia and an ejection fraction of 82%. A  cardiac catheterization was performed on June 21, 2007. Her LV function was normal and she had minor plaquing in her coronaries but no obstructive disease was noted. Last carotid Dopplers were performed in November of 2009 and showed 0-39% bilateral stenosis. Followup was recommended in 2 years.  An echocardiogram in August of 2010 showed normal LV function and diastolic dysfunction. I last saw her in Nov 2011. Since then she has well with no dyspnea, chest pain, palpitations or syncope. She occasionally feels fatigued immediately after ambulating.  Current Outpatient Prescriptions  Medication Sig Dispense Refill  . albuterol (PROVENTIL,VENTOLIN) 90 MCG/ACT inhaler Inhale 2 puffs into the lungs every 4 (four) hours as needed.        Marland Kitchen alendronate (FOSAMAX) 70 MG tablet Take 70 mg by mouth every 7 (seven) days. Take with a full glass of water on an empty stomach.       Marland Kitchen aspirin 81 MG tablet Take 81 mg by mouth daily.        . beclomethasone (QVAR) 40 MCG/ACT inhaler Inhale 2 puffs into the lungs daily as needed.       . calcium carbonate (OS-CAL) 600 MG TABS Take 600 mg by mouth daily.        . cetirizine (ZYRTEC) 10 MG tablet Take 10 mg by mouth daily as needed.        . fish oil-omega-3 fatty acids 1000 MG capsule 3 capsules by mouth once a day       . lisinopril (PRINIVIL,ZESTRIL) 20 MG tablet Take 1 tablet (20 mg total) by mouth daily.  30 tablet  6  . Multiple Vitamin (MULTIVITAMIN) capsule Take 1 capsule by mouth daily.        Marland Kitchen NEXIUM 40 MG capsule TAKE (1) CAPSULE DAILY.  90 each  2  . nitroGLYCERIN (NITROSTAT) 0.4 MG SL tablet Place 0.4 mg under the tongue every 5 (five) minutes as needed.        . ranitidine (ZANTAC) 150 MG tablet Take 150 mg by mouth  daily.        . rosuvastatin (CRESTOR) 5 MG tablet Take 5 mg by mouth daily.       . valACYclovir (VALTREX) 1000 MG tablet Take 1 tablet (1,000 mg total) by mouth 3 (three) times daily.  21 tablet  0     Past Medical History  Diagnosis Date  . BREAST CANCER, HX OF 03/16/2007  . PEPTIC ULCER DISEASE 03/16/2007  . IRRITABLE BOWEL SYNDROME, HX OF 03/16/2007  . DEGENERATIVE JOINT DISEASE, CERVICAL SPINE 03/16/2007  . HYPERLIPIDEMIA 05/18/2007  . ANXIETY DEPRESSION 08/25/2009  . Essential hypertension, benign 10/28/2008  . CORONARY ATHEROSCLEROSIS NATIVE CORONARY ARTERY 10/28/2008  . CAROTID ARTERY STENOSIS 10/28/2008  . Chronic rhinitis 09/17/2008  . GERD 10/10/2007  . Diverticulitis of colon (without mention of hemorrhage) 03/16/2007  . CAD (coronary artery disease) 2005    s/p DES to LCX  . Cystitis   . Palpitations     Past Surgical History  Procedure Date  . Reconstruction breast w/ tram flap     right  . Hernia repair 2002    ventral hernia  . Finger arthroplasty 2002    Right thumb  . Abdominal hysterectomy   . Cholecystectomy   .  Mastectomy   . Oophorectomy   . Varicose vein surgery     History   Social History  . Marital Status: Widowed    Spouse Name: N/A    Number of Children: 5  . Years of Education: N/A   Occupational History  .     Social History Main Topics  . Smoking status: Never Smoker   . Smokeless tobacco: Not on file  . Alcohol Use: Not on file  . Drug Use: Not on file  . Sexually Active: Not on file   Other Topics Concern  . Not on file   Social History Narrative   Has several children 3 dtrs, 2 sons, and several grandchildren in [place] and [address] She sees them often.Lives independently. Is very active    ROS: no fevers or chills, productive cough, hemoptysis, dysphasia, odynophagia, melena, hematochezia, dysuria, hematuria, rash, seizure activity, orthopnea, PND, pedal edema, claudication. Remaining systems are negative.  Physical  Exam: Well-developed well-nourished in no acute distress.  Skin is warm and dry.  HEENT is normal.  Neck is supple. No thyromegaly.  Chest is clear to auscultation with normal expansion.  Cardiovascular exam is regular rate and rhythm.  Abdominal exam nontender or distended. No masses palpated. Extremities show no edema. neuro grossly intact  ECG normal sinus rhythm at a rate of 89. Left axis deviation cannot rule out prior septal infarct.

## 2011-04-07 NOTE — Assessment & Plan Note (Signed)
Continue statin. 

## 2011-04-07 NOTE — Assessment & Plan Note (Signed)
Continue aspirin and statin. Plan myoview when she returns in one year.

## 2011-04-07 NOTE — Assessment & Plan Note (Signed)
Controlled with beta blocker  

## 2011-04-07 NOTE — Patient Instructions (Signed)
Your physician wants you to follow-up in: ONE YEAR You will receive a reminder letter in the mail two months in advance. If you don't receive a letter, please call our office to schedule the follow-up appointment.   Your physician has requested that you have a carotid duplex. This test is an ultrasound of the carotid arteries in your neck. It looks at blood flow through these arteries that supply the brain with blood. Allow one hour for this exam. There are no restrictions or special instructions.   

## 2011-05-24 ENCOUNTER — Encounter: Payer: Medicare Other | Admitting: *Deleted

## 2011-06-01 LAB — CARDIAC PANEL(CRET KIN+CKTOT+MB+TROPI)
CK, MB: 2
CK, MB: 2.5
Relative Index: 1
Relative Index: 1.1
Total CK: 190 — ABNORMAL HIGH
Total CK: 197 — ABNORMAL HIGH
Troponin I: 0.02
Troponin I: 0.02

## 2011-06-01 LAB — POCT I-STAT 7, (LYTES, BLD GAS, ICA,H+H)
Bicarbonate: 20.2
HCT: 38
Hemoglobin: 12.9
Operator id: 221371
Sodium: 139
TCO2: 21
pH, Arterial: 7.389
pO2, Arterial: 118 — ABNORMAL HIGH

## 2011-06-01 LAB — BASIC METABOLIC PANEL
Calcium: 8.6
GFR calc Af Amer: >60
GFR calc non Af Amer: >60
Glucose, Bld: 88
Potassium: 4
Sodium: 141

## 2011-06-01 LAB — POCT I-STAT 3, ART BLOOD GAS (G3+)
O2 Saturation: 97
TCO2: 22
pCO2 arterial: 33.7 — ABNORMAL LOW
pH, Arterial: 7.397
pO2, Arterial: 92

## 2011-06-01 LAB — COMPREHENSIVE METABOLIC PANEL
ALT: 21
Alkaline Phosphatase: 55
CO2: 28
Calcium: 9.1
Chloride: 103
GFR calc non Af Amer: >60
Glucose, Bld: 122 — ABNORMAL HIGH
Potassium: 3.9
Sodium: 136
Total Bilirubin: 0.4

## 2011-06-01 LAB — CBC
HCT: 35.7 — ABNORMAL LOW
Hemoglobin: 12.2
MCHC: 33.8
Platelets: 311
RBC: 3.93
RDW: 13.4
WBC: 4.1

## 2011-06-01 LAB — D-DIMER, QUANTITATIVE: D-Dimer, Quant: 1.08 — ABNORMAL HIGH

## 2011-06-14 ENCOUNTER — Encounter (INDEPENDENT_AMBULATORY_CARE_PROVIDER_SITE_OTHER): Payer: Medicare Other | Admitting: *Deleted

## 2011-06-14 DIAGNOSIS — I6529 Occlusion and stenosis of unspecified carotid artery: Secondary | ICD-10-CM

## 2011-06-20 ENCOUNTER — Other Ambulatory Visit: Payer: Medicare Other

## 2011-06-28 ENCOUNTER — Other Ambulatory Visit: Payer: Self-pay | Admitting: Internal Medicine

## 2011-06-28 DIAGNOSIS — Z79899 Other long term (current) drug therapy: Secondary | ICD-10-CM

## 2011-06-28 DIAGNOSIS — E7800 Pure hypercholesterolemia, unspecified: Secondary | ICD-10-CM

## 2011-06-30 ENCOUNTER — Ambulatory Visit: Payer: Medicare Other

## 2011-07-01 ENCOUNTER — Other Ambulatory Visit: Payer: Self-pay | Admitting: Internal Medicine

## 2011-07-01 DIAGNOSIS — Z1231 Encounter for screening mammogram for malignant neoplasm of breast: Secondary | ICD-10-CM

## 2011-07-04 ENCOUNTER — Other Ambulatory Visit (INDEPENDENT_AMBULATORY_CARE_PROVIDER_SITE_OTHER): Payer: Medicare Other

## 2011-07-04 ENCOUNTER — Other Ambulatory Visit: Payer: Medicare Other

## 2011-07-04 DIAGNOSIS — Z79899 Other long term (current) drug therapy: Secondary | ICD-10-CM

## 2011-07-04 DIAGNOSIS — E7800 Pure hypercholesterolemia, unspecified: Secondary | ICD-10-CM

## 2011-07-05 LAB — LIPID PANEL
HDL: 60.3 mg/dL (ref >39.00)
LDL Cholesterol: 102 mg/dL — ABNORMAL HIGH (ref 0–99)
Total CHOL/HDL Ratio: 3
Triglycerides: 95 mg/dL (ref 0.0–149.0)
VLDL: 19 mg/dL (ref 0.0–40.0)

## 2011-07-05 LAB — HEPATIC FUNCTION PANEL
AST: 25 U/L (ref 0–37)
Albumin: 3.8 g/dL (ref 3.5–5.2)
Total Bilirubin: 0.6 mg/dL (ref 0.3–1.2)

## 2011-07-07 ENCOUNTER — Ambulatory Visit (INDEPENDENT_AMBULATORY_CARE_PROVIDER_SITE_OTHER): Payer: Medicare Other

## 2011-07-07 DIAGNOSIS — I2510 Atherosclerotic heart disease of native coronary artery without angina pectoris: Secondary | ICD-10-CM

## 2011-07-07 NOTE — Progress Notes (Signed)
Mrs. Fong is a 77yo widowed female with coronary atherosclerosis, who presents today for a 6 month follow-up lipid consult. For cholesterol, she currently takes Crestor 5mg  daily and Fish Oil 3 grams every afternoon. She does continue to have some dull leg pain when she lays down at night (R>L, scale 6/10), but she has not had to take Tylenol OTC in months, and does not notice any increased discomfort since increasing her Crestor dose in May from 5mg  five times weekly to 5mg  daily. She takes fish oil without any complaints. She ran out of Crestor samples from our office, but got more from her another office and has not had to miss doses.   Diet: Since her bout of diverticulitis in February, she continues to avoid corn and most nuts. However, she will eat red meat now occasionally with dinner. She avoids high-fat and high-calorie foods by choosing skim milk with cereal and low-fat, sugar-free desserts for her sweet tooth.  Breakfast: Cheerios, skim milk, fruit, sometimes eggs.  Lunch: Usually salads and soups, or sandwiches  Dinner: While in [place], prepared by [facility]. [place] where [place] recently [place]  a townhouse [place], she admits to eating less healthy dinners. At [facility], she will eat out, eat  more fatty desserts, and drink more wine.  Dessert: diet pudding, sugar-free yogurt, or sugar-free/low-fat ice-cream She estimates at least 3 servings of fruits and vegetables daily. In [place], she estimates her alcohol intake to be one glass of wine a month, if that. She expresses that she is not interested in modifying her diet if it decreases her quality of life. She would like to be able to share a piece of full-fat cake with her friend.  Exercise:  She walks everyday, regardless of whether she's in [place] or by the beach at [place].  She has friends and daughters who like to walk with her, especially in [place]. Less so at the  beach.  She walks 1/2 hour through the large hallways of [facility] [address] in [place], but says walking is more sporadic at [employer].

## 2011-07-07 NOTE — Assessment & Plan Note (Addendum)
Assessment:  Pt's cholesterol has improved since her last visit in May. TC-181 (goal<200), TG-95 (goal<150), HDL-60.3 (goal>45), and LDL-102 (goal<100, was 160 on 12/27/10). Increasing Crestor to 5mg  every day in addition to Fish oil 3 grams daily is working well for her. However relying on samples will not support her adherence to this statin. We discussed the possibility of switching to another, more affordable statin. She expressed openness to switching, but likes being on Crestor because it is working for her.   Plan: 1. Continue Crestor 5 mg daily and Fish Oil 3 grams daily. 2. Continue healthy diet, high in fiber, 3-5 servings of fruits and vegetables daily, and minimizing high-calorie/high-fat desserts. 3. Continue exercising most days of the week, aiming for at least 30 minutes of activity at a time. 4. Will check lipids again in 6 months.  5. If obtaining samples of Crestor proves difficult to obtain and hinders adherence, may consider lovastatin equivalent at next visit. Pt mentions having tried simvastatin and Vytorin in the past without benefit.

## 2011-07-07 NOTE — Patient Instructions (Signed)
1. Continue your Crestor 5 mg daily and Fish Oil 3 grams daily. 2. Continue healthy diet, high in fiber, 3-5 servings of fruits and vegetables daily, and minimizing high-calorie/high-fat desserts. 3. Continue exercising most days of the week, aiming for at least 30 minutes of activity at a time. 4. Follow up in 6 months.

## 2011-07-08 ENCOUNTER — Other Ambulatory Visit: Payer: Self-pay | Admitting: Internal Medicine

## 2011-07-10 ENCOUNTER — Encounter: Payer: Self-pay | Admitting: Internal Medicine

## 2011-07-12 ENCOUNTER — Ambulatory Visit (INDEPENDENT_AMBULATORY_CARE_PROVIDER_SITE_OTHER): Payer: Medicare Other | Admitting: Internal Medicine

## 2011-07-12 DIAGNOSIS — K5732 Diverticulitis of large intestine without perforation or abscess without bleeding: Secondary | ICD-10-CM

## 2011-07-12 MED ORDER — METRONIDAZOLE 500 MG PO TABS: 500.0000 mg | ORAL_TABLET | Freq: Three times a day (TID) | ORAL | Status: AC

## 2011-07-12 MED ORDER — CIPROFLOXACIN HCL 500 MG PO TABS: 500.0000 mg | ORAL_TABLET | Freq: Two times a day (BID) | ORAL | Status: AC

## 2011-07-12 NOTE — Patient Instructions (Signed)
Presentation is consistent with diverticulitis. Plan - cipro 500 mg twice a day for 10 days; flagyl 500 mg three times a day for 10 days. Call if you cannot keep down the medication, if the pain does not improve in 24-48 hrs, if the pain gets worse, if you have nausea or vomiting. You may eat whatever you can tolerate. It is important that you take in enough fluids. You may travel if you are feeling better.  Diverticulitis A diverticulum is a small pouch or sac on the colon. Diverticulosis is the presence of these diverticula on the colon. Diverticulitis is the irritation (inflammation) or infection of diverticula. CAUSES   The colon and its diverticula contain bacteria. If food particles block the tiny opening to a diverticulum, the bacteria inside can grow and cause an increase in pressure. This leads to infection and inflammation and is called diverticulitis. SYMPTOMS    Abdominal pain and tenderness. Usually, the pain is located on the left side of your abdomen. However, it could be located elsewhere.     Fever.    Bloating.    Feeling sick to your stomach (nausea).     Throwing up (vomiting).     Abnormal stools.  DIAGNOSIS   Your caregiver will take a history and perform a physical exam. Since many things can cause abdominal pain, other tests may be necessary. Tests may include:  Blood tests.     Urine tests.     X-ray of the abdomen.     CT scan of the abdomen.  Sometimes, surgery is needed to determine if diverticulitis or other conditions are causing your symptoms. TREATMENT   Most of the time, you can be treated without surgery. Treatment includes:  Resting the bowels by only having liquids for a few days. As you improve, you will need to eat a low-fiber diet.     Intravenous (IV) fluids if you are losing body fluids (dehydrated).     Antibiotic medicines that treat infections may be given.     Pain and nausea medicine, if needed.     Surgery if the inflamed  diverticulum has burst.  HOME CARE INSTRUCTIONS    Try a clear liquid diet (broth, tea, or water for as long as directed by your caregiver). You may then gradually begin a low-fiber diet as tolerated. A low-fiber diet is a diet with less than 10 grams of fiber. Choose the foods below to reduce fiber in the diet:     White breads, cereals, rice, and pasta.     Cooked fruits and vegetables or soft fresh fruits and vegetables without the skin.     Ground or well-cooked tender beef, ham, veal, lamb, pork, or poultry.     Eggs and seafood.     After your diverticulitis symptoms have improved, your caregiver may put you on a high-fiber diet. A high-fiber diet includes 14 grams of fiber for every 1000 calories consumed. For a standard 2000 calorie diet, you would need 28 grams of fiber. Follow these diet guidelines to help you increase the fiber in your diet. It is important to slowly increase the amount fiber in your diet to avoid gas, constipation, and bloating.     Choose whole-grain breads, cereals, pasta, and brown rice.     Choose fresh fruits and vegetables with the skin on. Do not overcook vegetables because the more vegetables are cooked, the more fiber is lost.     Choose more nuts, seeds, legumes, dried peas, beans,  and lentils.     Look for food products that have greater than 3 grams of fiber per serving on the Nutrition Facts label.     Take all medicine as directed by your caregiver.     If your caregiver has given you a follow-up appointment, it is very important that you go. Not going could result in lasting (chronic) or permanent injury, pain, and disability. If there is any problem keeping the appointment, call to reschedule.  SEEK MEDICAL CARE IF:    Your pain does not improve.     You have a hard time advancing your diet beyond clear liquids.     Your bowel movements do not return to normal.  SEEK IMMEDIATE MEDICAL CARE IF:    Your pain becomes worse.     You have  an oral temperature above 102 F (38.9 C), not controlled by medicine.     You have repeated vomiting.     You have bloody or black, tarry stools.     Symptoms that brought you to your caregiver become worse or are not getting better.  MAKE SURE YOU:    Understand these instructions.     Will watch your condition.     Will get help right away if you are not doing well or get worse.  Document Released: 05/18/2005 Document Revised: 04/20/2011 Document Reviewed: 09/13/2010 [facility] Patient Information 2012 [place], [place].

## 2011-07-13 NOTE — Progress Notes (Signed)
  Subjective:    Patient ID: [name], female    DOB: 06-03-1930, 77 y.o.   MRN: [id number]  HPI Mrs. Credeur is seen acutely for LLQ abdominal pain that began in the last 24 hours. She has had diverticulitis in the past. There has been no fever, rigors. She admits to loss of appetite but has had no emesis. She has had increased frequency of stooling but no diarrhea, no hematochezia. She reports upper abdominal and substernal discomfort that is distinctly different from her previous experience with angina but similar to the discomfort at her last episode of diverticulitis.  I have reviewed the patient's medical history in detail and updated the computerized patient record.    Review of Systems System review is negative for any constitutional, cardiac, pulmonary, GI or neuro symptoms or complaints other than as described in the HPI.     Objective:   Physical Exam Vitals reviewed - no fever Gen'l - WNWD white woman in no distress Chest - clear to A&P Abdomen - hypoactive BS, no guarding, tender to palpation - worse at the LLQ, no rebound tenderness       Assessment & Plan:

## 2011-07-13 NOTE — Assessment & Plan Note (Signed)
Presentation is c/w recurrent diverticulitis.  Plan - Cipro 500 mg bid; flagyl 500 mg tid both for ten days           Hydrate           Call for inability to keep down meds, or fluids, for increased pain or fever.           Diet of choice, may travel out of town if feeling better.

## 2011-07-21 ENCOUNTER — Inpatient Hospital Stay (HOSPITAL_COMMUNITY): Payer: Medicare Other

## 2011-07-21 ENCOUNTER — Other Ambulatory Visit: Payer: Self-pay

## 2011-07-21 ENCOUNTER — Encounter: Payer: Self-pay | Admitting: Internal Medicine

## 2011-07-21 ENCOUNTER — Telehealth: Payer: Self-pay | Admitting: *Deleted

## 2011-07-21 ENCOUNTER — Ambulatory Visit (INDEPENDENT_AMBULATORY_CARE_PROVIDER_SITE_OTHER): Payer: Medicare Other | Admitting: Internal Medicine

## 2011-07-21 ENCOUNTER — Inpatient Hospital Stay (HOSPITAL_COMMUNITY)
Admission: AD | Admit: 2011-07-21 | Discharge: 2011-07-30 | DRG: 392 | Disposition: A | Discharge status: Home / Self Care | Payer: Medicare Other | Source: Ambulatory Visit | Attending: Internal Medicine | Admitting: Internal Medicine

## 2011-07-21 VITALS — BP 90/62 | HR 101 | Temp 98.4°F

## 2011-07-21 DIAGNOSIS — Z853 Personal history of malignant neoplasm of breast: Secondary | ICD-10-CM

## 2011-07-21 DIAGNOSIS — I2510 Atherosclerotic heart disease of native coronary artery without angina pectoris: Secondary | ICD-10-CM | POA: Diagnosis present

## 2011-07-21 DIAGNOSIS — F3289 Other specified depressive episodes: Secondary | ICD-10-CM | POA: Diagnosis present

## 2011-07-21 DIAGNOSIS — K5732 Diverticulitis of large intestine without perforation or abscess without bleeding: Secondary | ICD-10-CM

## 2011-07-21 DIAGNOSIS — F329 Major depressive disorder, single episode, unspecified: Secondary | ICD-10-CM | POA: Diagnosis present

## 2011-07-21 DIAGNOSIS — R5381 Other malaise: Secondary | ICD-10-CM | POA: Diagnosis present

## 2011-07-21 DIAGNOSIS — E785 Hyperlipidemia, unspecified: Secondary | ICD-10-CM

## 2011-07-21 DIAGNOSIS — F411 Generalized anxiety disorder: Secondary | ICD-10-CM | POA: Diagnosis present

## 2011-07-21 DIAGNOSIS — R0789 Other chest pain: Secondary | ICD-10-CM

## 2011-07-21 DIAGNOSIS — K589 Irritable bowel syndrome without diarrhea: Secondary | ICD-10-CM | POA: Diagnosis present

## 2011-07-21 LAB — CBC
HCT: 41 % (ref 36.0–46.0)
MCH: 30.8 pg (ref 26.0–34.0)
MCHC: 33.9 g/dL (ref 30.0–36.0)
MCV: 90.7 fL (ref 78.0–100.0)
Platelets: 309 10*3/uL (ref 150–400)
RDW: 13.1 % (ref 11.5–15.5)

## 2011-07-21 LAB — CARDIAC PANEL(CRET KIN+CKTOT+MB+TROPI)
CK, MB: 2 ng/mL (ref 0.3–4.0)
Troponin I: <0.3 ng/mL (ref <0.30)

## 2011-07-21 LAB — LIPASE, BLOOD: Lipase: 33 U/L (ref 11–59)

## 2011-07-21 LAB — BASIC METABOLIC PANEL
BUN: 17 mg/dL (ref 6–23)
Calcium: 9.6 mg/dL (ref 8.4–10.5)
Creatinine, Ser: 1.04 mg/dL (ref 0.50–1.10)
GFR calc Af Amer: 58 mL/min — ABNORMAL LOW (ref >90)

## 2011-07-21 LAB — HEPATIC FUNCTION PANEL
Bilirubin, Direct: <0.1 mg/dL (ref 0.0–0.3)
Total Protein: 7.7 g/dL (ref 6.0–8.3)

## 2011-07-21 MED ORDER — ASPIRIN 81 MG PO CHEW
81.0000 mg | CHEWABLE_TABLET | Freq: Every day | ORAL | Status: DC
  Administered 2011-07-21 – 2011-07-30 (×10): 81 mg via ORAL
  Filled 2011-07-21 (×10): qty 1

## 2011-07-21 MED ORDER — ROSUVASTATIN CALCIUM 5 MG PO TABS
5.0000 mg | ORAL_TABLET | Freq: Every day | ORAL | Status: DC
  Administered 2011-07-21 – 2011-07-30 (×10): 5 mg via ORAL
  Filled 2011-07-21 (×10): qty 1

## 2011-07-21 MED ORDER — SENNA 8.6 MG PO TABS
1.0000 | ORAL_TABLET | Freq: Two times a day (BID) | ORAL | Status: DC
  Administered 2011-07-27 – 2011-07-28 (×2): 8.6 mg via ORAL
  Filled 2011-07-21 (×5): qty 1

## 2011-07-21 MED ORDER — ONDANSETRON HCL 4 MG PO TABS
4.0000 mg | ORAL_TABLET | Freq: Four times a day (QID) | ORAL | Status: DC | PRN
  Administered 2011-07-28 – 2011-07-30 (×3): 4 mg via ORAL
  Filled 2011-07-21 (×3): qty 1

## 2011-07-21 MED ORDER — ALBUTEROL 90 MCG/ACT IN AERS
2.0000 | INHALATION_SPRAY | RESPIRATORY_TRACT | Status: DC | PRN
  Filled 2011-07-21: qty 2

## 2011-07-21 MED ORDER — PANTOPRAZOLE SODIUM 40 MG PO TBEC
40.0000 mg | DELAYED_RELEASE_TABLET | Freq: Every day | ORAL | Status: DC
  Administered 2011-07-22 – 2011-07-25 (×4): 40 mg via ORAL
  Filled 2011-07-21 (×4): qty 1

## 2011-07-21 MED ORDER — LORATADINE 10 MG PO TABS
10.0000 mg | ORAL_TABLET | Freq: Every day | ORAL | Status: DC
  Administered 2011-07-21 – 2011-07-30 (×10): 10 mg via ORAL
  Filled 2011-07-21 (×10): qty 1

## 2011-07-21 MED ORDER — ACETAMINOPHEN 325 MG PO TABS
650.0000 mg | ORAL_TABLET | Freq: Four times a day (QID) | ORAL | Status: DC | PRN
  Administered 2011-07-27 – 2011-07-28 (×6): 650 mg via ORAL
  Filled 2011-07-21 (×6): qty 2

## 2011-07-21 MED ORDER — BIOTENE DRY MOUTH MT LIQD
15.0000 mL | Freq: Two times a day (BID) | OROMUCOSAL | Status: DC
  Administered 2011-07-22 – 2011-07-30 (×11): 15 mL via OROMUCOSAL

## 2011-07-21 MED ORDER — ONDANSETRON HCL 4 MG/2ML IJ SOLN
4.0000 mg | Freq: Four times a day (QID) | INTRAMUSCULAR | Status: DC | PRN
  Administered 2011-07-22 – 2011-07-28 (×10): 4 mg via INTRAVENOUS
  Filled 2011-07-21 (×10): qty 2

## 2011-07-21 MED ORDER — NITROGLYCERIN 0.4 MG SL SUBL
0.4000 mg | SUBLINGUAL_TABLET | SUBLINGUAL | Status: DC | PRN
  Administered 2011-07-24 (×2): 0.4 mg via SUBLINGUAL
  Filled 2011-07-21: qty 25

## 2011-07-21 MED ORDER — ZOLPIDEM TARTRATE 5 MG PO TABS
5.0000 mg | ORAL_TABLET | Freq: Every evening | ORAL | Status: DC | PRN
  Administered 2011-07-22 – 2011-07-28 (×6): 5 mg via ORAL
  Filled 2011-07-21 (×5): qty 1

## 2011-07-21 MED ORDER — LISINOPRIL 20 MG PO TABS
20.0000 mg | ORAL_TABLET | Freq: Every day | ORAL | Status: DC
  Administered 2011-07-21 – 2011-07-30 (×10): 20 mg via ORAL
  Filled 2011-07-21 (×10): qty 1

## 2011-07-21 MED ORDER — DEXTROSE-NACL 5-0.45 % IV SOLN
INTRAVENOUS | Status: DC
  Administered 2011-07-21 – 2011-07-26 (×10): via INTRAVENOUS

## 2011-07-21 MED ORDER — TRAMADOL HCL 50 MG PO TABS
50.0000 mg | ORAL_TABLET | Freq: Four times a day (QID) | ORAL | Status: DC | PRN
  Administered 2011-07-28: 50 mg via ORAL
  Filled 2011-07-21: qty 1

## 2011-07-21 MED ORDER — ACETAMINOPHEN 650 MG RE SUPP: 650.0000 mg | Freq: Four times a day (QID) | RECTAL | Status: DC | PRN

## 2011-07-21 MED ORDER — ENOXAPARIN SODIUM 40 MG/0.4ML ~~LOC~~ SOLN
40.0000 mg | SUBCUTANEOUS | Status: DC
  Administered 2011-07-21 – 2011-07-29 (×9): 40 mg via SUBCUTANEOUS
  Filled 2011-07-21 (×11): qty 0.4

## 2011-07-21 NOTE — H&P (Signed)
KRISTAL PERL is an 77 y.o. female.   Chief Complaint: Abdominal pain HPI: Mrs. Arnett, a very pleasant 77 y/o woman was seen 07/12/11 for LLQ abdominal pain presumed to be diverticulitis, which she has had before. She was stable w/o N/V, no fever. She was started on Cipro and flagyl. Over the succeeding days she felt increasingly sick: persistent abdominal pain, no appetite, nauseated but without emesis. She Also became very weak and essentially bedbound. Her friend and neighbor was looking in on her and called the office today reporting Mrs. McCalls condition. She was seen on an urgent basis. In the office she was orthostatic: Laying 118/72, sitting 104/80, standing 90/62. On exam she had abdominal tenderness that was diffuse but worse in the LLQ. She also c/o chest pressure. She is now admitted for IV hydration, evaluation of abdominal pain and to r/o cardiac issues.   Past Medical History  Diagnosis Date  . BREAST CANCER, HX OF 03/16/2007  . PEPTIC ULCER DISEASE 03/16/2007  . IRRITABLE BOWEL SYNDROME, HX OF 03/16/2007  . DEGENERATIVE JOINT DISEASE, CERVICAL SPINE 03/16/2007  . HYPERLIPIDEMIA 05/18/2007  . ANXIETY DEPRESSION 08/25/2009  . Essential hypertension, benign 10/28/2008  . CORONARY ATHEROSCLEROSIS NATIVE CORONARY ARTERY 10/28/2008  . CAROTID ARTERY STENOSIS 10/28/2008  . Chronic rhinitis 09/17/2008  . GERD 10/10/2007  . Diverticulitis of colon (without mention of hemorrhage) 03/16/2007  . CAD (coronary artery disease) 2005    s/p DES to LCX  . Cystitis   . Palpitations     Past Surgical History  Procedure Date  . Reconstruction breast w/ tram flap     right  . Hernia repair 2002    ventral hernia  . Finger arthroplasty 2002    Right thumb  . Abdominal hysterectomy   . Cholecystectomy   . Mastectomy   . Oophorectomy   . Varicose vein surgery     Family History  Problem Relation Age of Onset  . Cancer Father     Lung Cancer  . Asthma Other   . Cancer Other     breast  .  COPD    . Heart disease Mother    Social History:  reports that she has never smoked. She does not have any smokeless tobacco history on file. Her alcohol and drug histories not on file.  Allergies:  Allergies  Allergen Reactions  . Augmentin Diarrhea  . Codeine Nausea And Vomiting    Over-sedating  . Diazepam     Hallucination   . Meperidine Hcl Nausea Only  . Sulfamethoxazole W/Trimethoprim Diarrhea    No current facility-administered medications on file as of 07/21/2011.   Medications Prior to Admission  Medication Sig Dispense Refill  . albuterol (PROVENTIL,VENTOLIN) 90 MCG/ACT inhaler Inhale 2 puffs into the lungs every 4 (four) hours as needed.        Marland Kitchen aspirin 81 MG tablet Take 81 mg by mouth daily.        . beclomethasone (QVAR) 40 MCG/ACT inhaler Inhale 2 puffs into the lungs daily as needed.       . calcium carbonate (OS-CAL) 600 MG TABS Take 600 mg by mouth daily.        . cetirizine (ZYRTEC) 10 MG tablet Take 10 mg by mouth daily as needed.        . ciprofloxacin (CIPRO) 500 MG tablet Take 1 tablet (500 mg total) by mouth 2 (two) times daily.  20 tablet  0  . fish oil-omega-3 fatty acids 1000 MG capsule 3  capsules by mouth once a day       . FOSAMAX 70 MG tablet TAKE 1 TABLET WEEKLY.  4 each  2  . lisinopril (PRINIVIL,ZESTRIL) 20 MG tablet Take 1 tablet (20 mg total) by mouth daily.  30 tablet  6  . metroNIDAZOLE (FLAGYL) 500 MG tablet Take 1 tablet (500 mg total) by mouth 3 (three) times daily.  30 tablet  0  . Multiple Vitamin (MULTIVITAMIN) capsule Take 1 capsule by mouth daily.        Marland Kitchen NEXIUM 40 MG capsule TAKE (1) CAPSULE DAILY.  90 each  2  . nitroGLYCERIN (NITROSTAT) 0.4 MG SL tablet Place 0.4 mg under the tongue every 5 (five) minutes as needed.        . ranitidine (ZANTAC) 150 MG tablet Take 150 mg by mouth daily.        . rosuvastatin (CRESTOR) 5 MG tablet Take 5 mg by mouth daily.       . valACYclovir (VALTREX) 1000 MG tablet Take 1 tablet (1,000 mg total)  by mouth 3 (three) times daily.  21 tablet  0    No results found for this or any previous visit (from the past 48 hour(s)). No results found.  Review of Systems  Constitutional: Positive for fever, chills, weight loss and malaise/fatigue.  HENT: Negative for hearing loss, congestion and tinnitus.   Eyes: Negative for blurred vision, double vision and discharge.  Respiratory: Positive for cough. Negative for sputum production.   Cardiovascular: Negative for chest pain, palpitations, orthopnea and leg swelling.  Gastrointestinal: Positive for nausea, abdominal pain and diarrhea. Negative for blood in stool.  Genitourinary: Negative.   Musculoskeletal: Negative.   Skin: Negative.   Neurological: Positive for weakness. Negative for headaches.  Endo/Heme/Allergies: Negative.   Psychiatric/Behavioral: Negative.     There were no vitals taken for this visit. Physical Exam  Vitals - afebrile, BP as noted in HPI Gen'l - WNWD white woman who is very uncomfortable. HEENT- Painter/AT, C&S clear, oropharynx clear Neck- supple, w/o thyromegaly Nodes - negative cervical, inguinal regions Chest - no deformity Lungs - CTAP Cor- 2+ radial pulse, quiet precordium, RRR w/o murmur Abd- BS+ x 4 quadrants, no HSM, diffusely tender to light percussion and palpation, very tender to deep palpation worse in the LLQ, no guarding, no rebound. Pelvic- deferred Ext - no deformity Neuro - A&O x 3, CN II-XII normal, MS normal Assessment/Plan 1. GI - patient empirically treated for diverticulitis now with worsening abdominal pain, anorexia and nausea. She is orthostatic.   Plan - Lab - CBC, Hepatic profile, lipase           Imaging - CT abd/pelvis - r/o abscess  2. Cardiac - h/o CAD. She is c/o some chest pressure  Plan - 12 lead EKG            Cardiac panel q8 x 3  Will continue the majority of her home meds.   Casimiro Needle Norins 07/21/2011, 4:04 PM

## 2011-07-21 NOTE — Telephone Encounter (Signed)
Nurse call: Pt is not any better; has had Diverticulitis x10 days & diarrhea is continuing causing extreme weakness & dehydration. Pt is on last day of Cipro & Flagyl, no fever & vitals are Ok. Please advise.

## 2011-07-21 NOTE — Telephone Encounter (Signed)
Pt added on schedule for 01:30pm per MD

## 2011-07-22 LAB — CARDIAC PANEL(CRET KIN+CKTOT+MB+TROPI)
CK, MB: 2.1 ng/mL (ref 0.3–4.0)
CK, MB: 2.8 ng/mL (ref 0.3–4.0)
Total CK: 99 U/L (ref 7–177)
Troponin I: <0.3 ng/mL (ref <0.30)
Troponin I: <0.3 ng/mL (ref <0.30)

## 2011-07-22 LAB — GLUCOSE, CAPILLARY: Glucose-Capillary: 109 mg/dL — ABNORMAL HIGH (ref 70–99)

## 2011-07-22 MED ORDER — PIPERACILLIN-TAZOBACTAM 3.375 G IVPB
3.3750 g | Freq: Three times a day (TID) | INTRAVENOUS | Status: DC
  Administered 2011-07-22 – 2011-07-27 (×15): 3.375 g via INTRAVENOUS
  Filled 2011-07-22 (×19): qty 50

## 2011-07-22 NOTE — Progress Notes (Signed)
INITIAL ADULT NUTRITION ASSESSMENT Date: 07/22/2011   Time: 11:09 AM Reason for Assessment: Consult  ASSESSMENT: Female 77 y.o.  Dx: Abdominal pain  Hx:  Past Medical History  Diagnosis Date  . BREAST CANCER, HX OF 03/16/2007  . PEPTIC ULCER DISEASE 03/16/2007  . IRRITABLE BOWEL SYNDROME, HX OF 03/16/2007  . DEGENERATIVE JOINT DISEASE, CERVICAL SPINE 03/16/2007  . HYPERLIPIDEMIA 05/18/2007  . ANXIETY DEPRESSION 08/25/2009  . Essential hypertension, benign 10/28/2008  . CORONARY ATHEROSCLEROSIS NATIVE CORONARY ARTERY 10/28/2008  . CAROTID ARTERY STENOSIS 10/28/2008  . Chronic rhinitis 09/17/2008  . GERD 10/10/2007  . Diverticulitis of colon (without mention of hemorrhage) 03/16/2007  . CAD (coronary artery disease) 2005    s/p DES to LCX  . Cystitis   . Palpitations    Related Meds:  Scheduled Meds:   . antiseptic oral rinse  15 mL Mouth Rinse BID  . aspirin  81 mg Oral Daily  . enoxaparin  40 mg Subcutaneous Q24H  . lisinopril  20 mg Oral Daily  . loratadine  10 mg Oral Daily  . pantoprazole  40 mg Oral Daily  . piperacillin-tazobactam (ZOSYN)  IV  3.375 g Intravenous Q8H  . rosuvastatin  5 mg Oral Daily  . senna  1 tablet Oral BID   Continuous Infusions:   . dextrose 5 % and 0.45% NaCl 100 mL/hr at 07/22/11 0433   PRN Meds:.acetaminophen, acetaminophen, albuterol, nitroGLYCERIN, ondansetron (ZOFRAN) IV, ondansetron, traMADol, zolpidem  Ht: 5\' 4"  (162.6 cm)  Wt: 139 lb (63.05 kg)  Ideal Wt: 54.5kg % Ideal Wt: 115  Usual Wt: 63.6kg % Usual Wt: 99  Body mass index is 23.86 kg/(m^2).  Food/Nutrition Related Hx: Pt reports 10 days of poor appetite r/t being on antibiotics. Pt states her nausea has improved, however still has diarrhea, but that has also improved from occuring every hour to now only 4 episodes this morning. Pt aware of diet for diverticulitis. Pt reports weighing 146 pounds in October, and wanted to get back down to her usual weight of 140 pounds, but thinks  that this weight loss has been unintentional fluid weight loss from diarrhea.   CT of abdomen/pelvis on 11/29 showed: 1. Acute diverticulitis involving the proximal sigmoid colon, with  focal soft tissue inflammation, and then diverticula and trace  associated fluid. No evidence of perforation or abscess formation.  2. Diverticulosis involves the descending and proximal sigmoid  colon.  3. Small bilateral parapelvic renal cysts noted.  4. Scattered calcification along the abdominal aorta and its  branches.  5. Status post vertebroplasty at L2, with associated mild chronic  compression deformity, and mild grade 1 retrolisthesis of L2 on L3.  Labs:  CMP     Component Value Date/Time   NA 136 07/21/2011 1719   K 3.7 07/21/2011 1719   CL 100 07/21/2011 1719   CO2 24 07/21/2011 1719   GLUCOSE 114* 07/21/2011 1719   BUN 17 07/21/2011 1719   CREATININE 1.04 07/21/2011 1719   CALCIUM 9.6 07/21/2011 1719   PROT 7.7 07/21/2011 1719   ALBUMIN 3.6 07/21/2011 1719   AST 18 07/21/2011 1719   ALT 14 07/21/2011 1719   ALKPHOS 54 07/21/2011 1719   BILITOT 0.3 07/21/2011 1719   GFRNONAA 50* 07/21/2011 1719   GFRAA 58* 07/21/2011 1719   CBG (last 3)   Basename 07/22/11 0742  GLUCAP 109*    Intake/Output Summary (Last 24 hours) at 07/22/11 1113 Last data filed at 07/22/11 0400  Gross per 24 hour  Intake    950 ml  Output      0 ml  Net    950 ml    Diet Order: Cardiac   IVF:    dextrose 5 % and 0.45% NaCl Last Rate: 100 mL/hr at 07/22/11 0433    Estimated Nutritional Needs:   Kcal:1500-1650 Protein:65-75g Fluid:1.5-1.6L  NUTRITION DIAGNOSIS: -Inadequate oral intake (NI-2.1).  Status: Ongoing -Pt meets criteria for moderate malnutrition of acute illness AEB 4.8% weight loss in the past month and poor intake for the past 10 days  RELATED TO: poor appetite from antibiotics  AS EVIDENCE BY: pt statement, unintentional weight loss of 7 pounds in the past  month  MONITORING/EVALUATION(Goals):  1. Resolution of diarrhea 2. Pt to consume >75% of meals.   EDUCATION NEEDS: -No education needs identified at this time  INTERVENTION: Encouraged gradual increased intake. Pt not interested in nutritional supplements. Will monitor.   Dietitian # [phone]  DOCUMENTATION CODES Per approved criteria  -Non-severe (moderate) malnutrition in the context of acute illness or injury    Marshall Cork 07/22/2011, 11:09 AM

## 2011-07-22 NOTE — Progress Notes (Signed)
Subjective: Feels better. Has an appetite but is worried about nausea. Continues to have abdominal pain  Objective: Lab: CBC normal, Metabolic panel - normal; cardiac enzymes negative x 2  Imaging: CT abd/pelvis c/w acute diverticulitis sigmoid colon w/o abscess   Physical Exam: Vitals reviewed - afebrile, BP stable 118/72.  Pulm - normal respirations Cor 2+ radial ,RRR, quiet precordium Abd - BS+, very tender left lower quadrant    Assessment/Plan: 1. GI - no fever, no leukocytosis but clnically very tender and CT c/w persistent diverticulitis.  Plan- Zosyn 3.375 mg IV q8 - GI side effects unlikely with IV med          Advance diet  2. Cardiac - negative enzymes, no continued Chest pressure, EKG old inferior infarct - no change from last EKG   Illene Regulus 07/22/2011, 8:37 AM

## 2011-07-23 DIAGNOSIS — K5732 Diverticulitis of large intestine without perforation or abscess without bleeding: Secondary | ICD-10-CM

## 2011-07-23 DIAGNOSIS — K589 Irritable bowel syndrome without diarrhea: Secondary | ICD-10-CM

## 2011-07-23 MED ORDER — SERTRALINE HCL 50 MG PO TABS
50.0000 mg | ORAL_TABLET | Freq: Every day | ORAL | Status: DC
  Administered 2011-07-23 – 2011-07-30 (×8): 50 mg via ORAL
  Filled 2011-07-23 (×8): qty 1

## 2011-07-23 NOTE — Progress Notes (Signed)
Subjective: Ms. Durrett reports increased frequency of soft stool, but not frank diarrhea - no blood or mucus in the stool, no fevers, continue abdominal paibn. She reports a poor appetite and has had reflux/regurg.  Objective: Lab: no new lab  Imaging: no new imaging  Physical Exam: Vitals - stable, afebrile Pulm - normal respirations Cor - RRR Abd - BS+ x 4, no guarding or rebound. Tender diffusely but worse in the LLQ    Assessment/Plan: 1. GI- persistent pain but no fever. Now with frequent stools but not diarrhea. Still can be at risk for C.Diff.  Day # 2 Zosyn.    Plan- stool for C.Diff           Continue zosyn  3. Anxiety/Depression  Plan - zoloft 50 mg daily.   Casimiro Needle Lane Eland 07/23/2011, 10:55 AM

## 2011-07-24 ENCOUNTER — Inpatient Hospital Stay (HOSPITAL_COMMUNITY): Payer: Medicare Other

## 2011-07-24 ENCOUNTER — Other Ambulatory Visit: Payer: Self-pay

## 2011-07-24 DIAGNOSIS — R1032 Left lower quadrant pain: Secondary | ICD-10-CM

## 2011-07-24 DIAGNOSIS — R933 Abnormal findings on diagnostic imaging of other parts of digestive tract: Secondary | ICD-10-CM

## 2011-07-24 DIAGNOSIS — R197 Diarrhea, unspecified: Secondary | ICD-10-CM

## 2011-07-24 DIAGNOSIS — K5732 Diverticulitis of large intestine without perforation or abscess without bleeding: Secondary | ICD-10-CM

## 2011-07-24 LAB — CARDIAC PANEL(CRET KIN+CKTOT+MB+TROPI)
CK, MB: 2.4 ng/mL (ref 0.3–4.0)
Relative Index: 2.3 (ref 0.0–2.5)
Total CK: 104 U/L (ref 7–177)

## 2011-07-24 LAB — MRSA PCR SCREENING: MRSA by PCR: NEGATIVE

## 2011-07-24 MED ORDER — DIPHENOXYLATE-ATROPINE 2.5-0.025 MG PO TABS
1.0000 | ORAL_TABLET | Freq: Once | ORAL | Status: AC
  Administered 2011-07-24: 1 via ORAL
  Filled 2011-07-24: qty 1

## 2011-07-24 MED ORDER — SACCHAROMYCES BOULARDII 250 MG PO CAPS
250.0000 mg | ORAL_CAPSULE | Freq: Two times a day (BID) | ORAL | Status: DC
  Administered 2011-07-24 – 2011-07-30 (×13): 250 mg via ORAL
  Filled 2011-07-24 (×15): qty 1

## 2011-07-24 MED ORDER — DICYCLOMINE HCL 10 MG PO CAPS
10.0000 mg | ORAL_CAPSULE | Freq: Two times a day (BID) | ORAL | Status: DC
  Administered 2011-07-24 – 2011-07-30 (×12): 10 mg via ORAL
  Filled 2011-07-24 (×16): qty 1

## 2011-07-24 MED ORDER — NITROGLYCERIN 0.4 MG SL SUBL
SUBLINGUAL_TABLET | SUBLINGUAL | Status: AC
  Filled 2011-07-24: qty 50

## 2011-07-24 MED ORDER — NITROGLYCERIN 0.4 MG SL SUBL: 0.4000 mg | SUBLINGUAL_TABLET | SUBLINGUAL | Status: DC | PRN

## 2011-07-24 MED ORDER — METOPROLOL TARTRATE 25 MG PO TABS
25.0000 mg | ORAL_TABLET | Freq: Once | ORAL | Status: AC
  Administered 2011-07-24: 25 mg via ORAL
  Filled 2011-07-24 (×2): qty 1

## 2011-07-24 MED ORDER — ALUM & MAG HYDROXIDE-SIMETH 200-200-20 MG/5ML PO SUSP: 15.0000 mL | ORAL | Status: DC | PRN

## 2011-07-24 NOTE — Progress Notes (Signed)
Pt. Having upper epigastric pain before going down for CT scan and feeling nauseated.  4mg  IV Zofran given for nausea and call to Dr. Debby Bud.  Orders for Mylanta q 4 hours prn and if no relief 12 lead EKG.  Received call from CT stating pt. BP elevated 170/99 pain radiating into chest and shortness of breath.  EKG done and rapid response called to evaluate pt.  Call to Dr. Debby Bud to inform. Pt. Transferred to ICU stepdown in stable condition.

## 2011-07-24 NOTE — Progress Notes (Signed)
Subjective: Patient with continued diarrhea. Still with abdominal pain and intermittent nausea. No chest pain.  Objective: Lab: stool negative for C. Diff  Imaging: no new imaging   Physical Exam: Vitals - afebrile, BP stable Abd - BS + x 4, very tender diffusely worse at the LLQ Cor - RRR Psych - mood seems less anxious.      Assessment/Plan: Patient Active Hospital Problem List: Diverticulitis of colon (without mention of hemorrhage) (03/16/2007)   Assessment: Persistent pain but without fever. Continued loose stools. Question if caused by zosyn.    Plan: Repeat CT abd/pelvis r/o progressive inflammation or abscess formation           GI consult - call made to Dr. Marina Goodell  Essential hypertension, benign (10/28/2008)   Assessment: stable BP    Plan: NO changes in regimen  CORONARY ATHEROSCLEROSIS NATIVE CORONARY ARTERY (10/28/2008)   Assessment: Stable, with no chest pain   Plan: no new testing.     Casimiro Needle Ogechi Kuehnel 07/24/2011, 10:27 AM

## 2011-07-24 NOTE — Progress Notes (Signed)
Call NOte:  Called by Juliann Pulse, nurse on 3W - patient had SSCP on way to CT and then in CT had severe episode of chest pain, graded 10/10. Rapid response was called. EKG done - inferior MI changes age undetermine - pt had EKG 11/29 with inferior changes. She did c/o chest pain at admission - Cardiac enzymes were negative x 3.  Plan: Repeat EKG - one in [facility]did not come through to [facility] Cardiac panel q8 x 3 SL NTG as needed Lopressor 25 mg one time dose for PVCs Telemetry bed.  Orders entered.

## 2011-07-24 NOTE — Progress Notes (Signed)
  Subjective:    Patient ID: [name], female    DOB: 1930/03/08, 77 y.o.   MRN: [id number]  HPI [name] was recently seen for abdominal pain c/w diverticulitis and was started on cipro and flagyl. She returns for continued abdominal pain, very poor intake and dehydration. She is admitted.   Review of Systems     Objective:   Physical Exam        Assessment & Plan:

## 2011-07-24 NOTE — Consult Note (Signed)
Referring Provider: Dr. Serafina Mitchell Care Physician:  Illene Regulus, MD, MD Primary Gastroenterologist:  Dr. Christella Hartigan  Reason for Consultation:  Recurrent diverticulitis, with persistent diarrhea  HPI: [name] is a 77 y.o. female known to Dr. Wendall Papa from prior endoscopy done in 2010, which showed a small hiatal hernia and a Schatzki's ring. She has not seen Dr. Christella Hartigan since. Patient relates that she did have a very remote colonoscopy probably 15-20 years ago done by Dr. Sherin Quarry and has not had one since. Patient was hospitalized with diverticulitis in February of 2012, and says that she also had diarrhea around that episode.  She said when she got better,and was able to get off of antibiotics the diarrhea resolved.  Patient had onset again in mid November of nausea and than lower abdominal pain and was started on Cipro and Flagyl as an outpatient per Dr. Debby Bud on 07/12/2011. She says that she became more nauseated and developed  very frequent soft to loose stools after starting the antibiotics. She said she was going perhaps 8 times per day but did not have watery or liquid stool just very soft frequent stools. She did not have any melena or hematochezia. Her appetite was very poor she had nausea and some vomiting and was not was unable to keep much down. She was admitted on 07/22/2011 for IV antibiotics. She did have CT scan of the abdomen and pelvis done on 1029 which shows acute sigmoid diverticulitis not complicated.  She has been trying to eat over the past couple of days and has been on a solid diet. She says her pain feels about the same and she continues to have very frequent stools but she says are now loose. She says her stools of the consistency of mashed potatoes. She has been on IV Zosyn here   Past Medical History  Diagnosis Date  . BREAST CANCER, HX OF 03/16/2007  . PEPTIC ULCER DISEASE 03/16/2007  . IRRITABLE BOWEL SYNDROME, HX OF 03/16/2007  . DEGENERATIVE JOINT  DISEASE, CERVICAL SPINE 03/16/2007  . HYPERLIPIDEMIA 05/18/2007  . ANXIETY DEPRESSION 08/25/2009  . Essential hypertension, benign 10/28/2008  . CORONARY ATHEROSCLEROSIS NATIVE CORONARY ARTERY 10/28/2008  . CAROTID ARTERY STENOSIS 10/28/2008  . Chronic rhinitis 09/17/2008  . GERD 10/10/2007  . Diverticulitis of colon (without mention of hemorrhage) 03/16/2007  . CAD (coronary artery disease) 2005    s/p DES to LCX  . Cystitis   . Palpitations     Past Surgical History  Procedure Date  . Reconstruction breast w/ tram flap     right  . Hernia repair 2002    ventral hernia  . Finger arthroplasty 2002    Right thumb  . Abdominal hysterectomy   . Cholecystectomy   . Mastectomy   . Oophorectomy   . Varicose vein surgery     Prior to Admission medications   Medication Sig Start Date End Date Taking? Authorizing Provider  albuterol (PROVENTIL,VENTOLIN) 90 MCG/ACT inhaler Inhale 2 puffs into the lungs every 4 (four) hours as needed. Shortness of  breath   Yes Historical Provider, MD  alendronate (FOSAMAX) 70 MG tablet   07/08/11  Yes Duke Salvia, MD  aspirin 81 MG tablet Take 81 mg by mouth daily.     Yes Historical Provider, MD  calcium carbonate (OS-CAL) 600 MG TABS Take 600 mg by mouth daily.     Yes Historical Provider, MD  cetirizine (ZYRTEC) 10 MG tablet Take 10 mg by mouth daily as needed. allergies  Yes Historical Provider, MD  fish oil-omega-3 fatty acids 1000 MG capsule 3 capsules by mouth once a day    Yes Historical Provider, MD  lisinopril (PRINIVIL,ZESTRIL) 20 MG tablet Take 1 tablet (20 mg total) by mouth daily. 01/21/11  Yes Lewayne Bunting, MD  Multiple Vitamin (MULTIVITAMIN) capsule Take 1 capsule by mouth daily.     Yes Historical Provider, MD  NEXIUM 40 MG capsule TAKE (1) CAPSULE DAILY. 02/11/11  Yes Duke Salvia, MD  nitroGLYCERIN (NITROSTAT) 0.4 MG SL tablet Place 0.4 mg under the tongue every 5 (five) minutes as needed. Chest pains.   Yes Historical  Provider, MD  ranitidine (ZANTAC) 150 MG tablet Take 150 mg by mouth daily.     Yes Historical Provider, MD  rosuvastatin (CRESTOR) 5 MG tablet Take 5 mg by mouth daily.    Yes Historical Provider, MD  valACYclovir (VALTREX) 1000 MG tablet Take 1 tablet (1,000 mg total) by mouth 3 (three) times daily. 12/27/10 12/27/11 Yes Romero Belling, MD    Current Facility-Administered Medications  Medication Dose Route Frequency Provider Last Rate Last Dose  . acetaminophen (TYLENOL) tablet 650 mg  650 mg Oral Q6H PRN Duke Salvia, MD       Or  . acetaminophen (TYLENOL) suppository 650 mg  650 mg Rectal Q6H PRN Duke Salvia, MD      . albuterol (PROVENTIL,VENTOLIN) inhaler 2 puff  2 puff Inhalation Q4H PRN Duke Salvia, MD      . antiseptic oral rinse (BIOTENE) solution 15 mL  15 mL Mouth Rinse BID Duke Salvia, MD   15 mL at 07/22/11 2000  . aspirin chewable tablet 81 mg  81 mg Oral Daily Duke Salvia, MD   81 mg at 07/24/11 1610  . dextrose 5 %-0.45 % sodium chloride infusion   Intravenous Continuous Duke Salvia, MD 100 mL/hr at 07/24/11 0700    . enoxaparin (LOVENOX) injection 40 mg  40 mg Subcutaneous Q24H Duke Salvia, MD   40 mg at 07/23/11 1751  . lisinopril (PRINIVIL,ZESTRIL) tablet 20 mg  20 mg Oral Daily Duke Salvia, MD   20 mg at 07/24/11 0943  . loratadine (CLARITIN) tablet 10 mg  10 mg Oral Daily Duke Salvia, MD   10 mg at 07/24/11 0944  . nitroGLYCERIN (NITROSTAT) SL tablet 0.4 mg  0.4 mg Sublingual Q5 min PRN Duke Salvia, MD      . ondansetron [facility]) tablet 4 mg  4 mg Oral Q6H PRN Duke Salvia, MD       Or  . ondansetron [facility]) injection 4 mg  4 mg Intravenous Q6H PRN Duke Salvia, MD   4 mg at 07/24/11 0945  . pantoprazole (PROTONIX) EC tablet 40 mg  40 mg Oral Daily Duke Salvia, MD   40 mg at 07/24/11 9604  . piperacillin-tazobactam (ZOSYN) IVPB 3.375 g   3.375 g Intravenous Q8H Duke Salvia, MD   3.375 g at 07/24/11 0950  . rosuvastatin (CRESTOR) tablet 5 mg  5 mg Oral Daily Duke Salvia, MD   5 mg at 07/24/11 [phone]  . senna (SENOKOT) tablet 8.6 mg  1 tablet Oral BID Duke Salvia, MD      . sertraline (ZOLOFT) tablet 50 mg  50 mg Oral Daily Duke Salvia, MD   50 mg at 07/24/11 0943  . traMADol (ULTRAM) tablet 50 mg  50 mg Oral Q6H PRN Duke Salvia, MD      .  zolpidem (AMBIEN) tablet 5 mg  5 mg Oral QHS PRN Duke Salvia, MD   5 mg at 07/23/11 2215    Allergies as of 07/21/2011 - Review Complete 07/21/2011  Allergen Reaction Noted  . Augmentin Diarrhea 12/30/2010  . Codeine Nausea And Vomiting   . Diazepam  03/16/2007  . Meperidine hcl Nausea Only 03/16/2007  . Sulfamethoxazole w/trimethoprim Diarrhea 02/18/2008    Family History  Problem Relation Age of Onset  . Cancer Father     Lung Cancer  . Asthma Other   . Cancer Other     breast  . COPD    . Heart disease Mother     History   Social History  . Marital Status: Widowed    Spouse Name: N/A    Number of Children: 5  . Years of Education: N/A   Occupational History  .     Social History Main Topics  . Smoking status: Never Smoker   . Smokeless tobacco: Not on file  . Alcohol Use: Not on file  . Drug Use: Not on file  . Sexually Active: Not on file   Other Topics Concern  . Not on file   Social History Narrative   Has several children 3 dtrs, 2 sons, and several grandchildren in [place] and [address] She sees them often.Lives independently. Is very active    Review of Systems: Pertinent positive and negative review of systems were noted in the above HPI section.  All other review of systems was otherwise negative.  Physical Exam: Vital signs in last 24 hours: Temp:  [97.9 F (36.6 C)-98.4 F (36.9 C)] 98.4 F (36.9 C) (12/02 0700) Pulse Rate:  [82-88] 88  (12/02 0700) Resp:  [16-20] 20  (12/02  0700) BP: (108-131)/(55-75) 125/75 mmHg (12/02 0700) SpO2:  [98 %] 98 % (12/02 0700) Last BM Date: 07/24/11 General:   Alert,  Well-developed, well-nourished, pleasant and cooperative in NAD Head:  Normocephalic and atraumatic. Eyes:  Sclera clear, no icterus.   Conjunctiva pink.  Nose:  [name] acuity. deformity, discharge,  or lesions. Mouth:  No deformity or lesions.  Oropharynx pink & moist,no thrush. Neck:  Supple; no masses or thyromegaly. Lungs:  Clear throughout to auscultation.   No wheezes, crackles, or rhonchi. No acute distress. Heart:  Regular rate and rhythm; no murmurs, clicks, rubs,  or gallops. ABDOMEN; soft she is tender in the right lower quadrant and more so in the left lower quadrant with guarding but no rebound. Bowel sounds are present   Rectal:  Deferred u   Msk:  Symmetrical without gross deformities. Normal posture. Pulses:  Normal pulses noted. Extremities:  Without clubbing or edema. Neurologic:  Alert and  oriented x4;  grossly normal neurologically. Psych:  Alert and cooperative. Normal mood and affect.  Intake/Output from previous day: 12/01 0701 - 12/02 0700 In: 4529.8 [P.O.:480; I.V.:3955; IV Piggyback:94.8] Out: 413 [Urine:405; Stool:8] Intake/Output this shift:    Lab Results:  Basename 07/21/11 1719  WBC 8.4  HGB 13.9  HCT 41.0  PLT 309   BMET  Basename 07/21/11 1719  NA 136  K 3.7  CL 100  CO2 24  GLUCOSE 114*  BUN 17  CREATININE 1.04  CALCIUM 9.6   LFT  Basename 07/21/11 1719  PROT 7.7  ALBUMIN 3.6  AST 18  ALT 14  ALKPHOS 54  BILITOT 0.3  BILIDIR <0.1  IBILI NOT CALCULATED       C-Diff Negative this admit  Studies/Results: No  results found.  Impression:  #21 77 year old female with recurrent sigmoid diverticulitis, second episode this year requiring hospitalization, uncomplicated by CT. #2 diarrhea associated with her episodes of diverticulitis. This may be more of an IBS type response, versus antibiotic  induced versus diverticulitis/colitis-type picture. #3 nausea, suspect multifactorial secondary to diverticulitis and antibiotics.  Plan: #1 Changed to full liquid diet #2 Continue Zosyn #3 around-the-clock anti-emetics #4 Will give one dose of Lomotil today and then start Bentyl 10 mg by mouth twice daily #5 start for store one by mouth twice daily #6 Patient should have colonoscopy after this episode has cleared, and also should be considered for elective resection. This was discussed with the patient and she is interested as she is very active and travels frequently. Thank you for the consult we will follow   [name]  07/24/2011, 11:33 AM  GI ATTENDING HX,LABS, XRAYS REVIEWED. PATIENT SEEN AND EXAMINED. DAUGHTER IN ROOM. RECURRENT DIVERTICULITIS, NOW WITH PERSISTING CASE AND DIARRHEA. C. DIFF NEGATIVE. AGREE WITH PLANS AS OUTLINED. AWAIT CT. THANKS.... JNP

## 2011-07-24 NOTE — Progress Notes (Signed)
Nutrition Follow Up  New consult received for diverticulosis education.   Diet: Full liquids, 75% intake   Patient reports she continues to have some nausea and abdominal pain, but this is improved.  No new weight.   Meds reviewed.  Labs:  CMP     Component Value Date/Time   NA 136 07/21/2011 1719   K 3.7 07/21/2011 1719   CL 100 07/21/2011 1719   CO2 24 07/21/2011 1719   GLUCOSE 114* 07/21/2011 1719   BUN 17 07/21/2011 1719   CREATININE 1.04 07/21/2011 1719   CALCIUM 9.6 07/21/2011 1719   PROT 7.7 07/21/2011 1719   ALBUMIN 3.6 07/21/2011 1719   AST 18 07/21/2011 1719   ALT 14 07/21/2011 1719   ALKPHOS 54 07/21/2011 1719   BILITOT 0.3 07/21/2011 1719   GFRNONAA 50* 07/21/2011 1719   GFRAA 58* 07/21/2011 1719   Nutrition Dx: Inadequate oral intake, improved.  New Dx: Food and nutrition related knowledge deficit r/t diverticulosis AEB patient report of not following a specific diet.   Goal:  1. Resolution of diarrhea, not met, 8 BM reported yesterday. 2. Patient to consume >75% of meals, meeting  Monitor: PO intake, weight, labs, resolution of diarrhea  Intervention:  1. Discussed with patient a high fiber diet to prevent diverticulitis. Patient was provided with a handout.  2. Advance diet as tolerated.   Linnell Fulling, RD, LDN

## 2011-07-25 ENCOUNTER — Ambulatory Visit: Payer: Medicare Other | Admitting: Internal Medicine

## 2011-07-25 ENCOUNTER — Inpatient Hospital Stay (HOSPITAL_COMMUNITY): Payer: Medicare Other

## 2011-07-25 DIAGNOSIS — R1032 Left lower quadrant pain: Secondary | ICD-10-CM

## 2011-07-25 DIAGNOSIS — R072 Precordial pain: Secondary | ICD-10-CM

## 2011-07-25 DIAGNOSIS — K5732 Diverticulitis of large intestine without perforation or abscess without bleeding: Secondary | ICD-10-CM

## 2011-07-25 DIAGNOSIS — R197 Diarrhea, unspecified: Secondary | ICD-10-CM

## 2011-07-25 LAB — CARDIAC PANEL(CRET KIN+CKTOT+MB+TROPI)
Relative Index: 2 (ref 0.0–2.5)
Total CK: 93 U/L (ref 7–177)
Troponin I: <0.3 ng/mL (ref <0.30)

## 2011-07-25 LAB — CBC
HCT: 31.7 % — ABNORMAL LOW (ref 36.0–46.0)
Hemoglobin: 10.7 g/dL — ABNORMAL LOW (ref 12.0–15.0)
MCV: 89.8 fL (ref 78.0–100.0)
RDW: 13.1 % (ref 11.5–15.5)
WBC: 4.5 10*3/uL (ref 4.0–10.5)

## 2011-07-25 LAB — BASIC METABOLIC PANEL
BUN: 4 mg/dL — ABNORMAL LOW (ref 6–23)
CO2: 23 mEq/L (ref 19–32)
Calcium: 8.2 mg/dL — ABNORMAL LOW (ref 8.4–10.5)
Creatinine, Ser: 0.91 mg/dL (ref 0.50–1.10)
Glucose, Bld: 119 mg/dL — ABNORMAL HIGH (ref 70–99)

## 2011-07-25 LAB — DIFFERENTIAL
Basophils Absolute: 0 10*3/uL (ref 0.0–0.1)
Eosinophils Relative: 3 % (ref 0–5)
Lymphocytes Relative: 39 % (ref 12–46)
Monocytes Absolute: 0.5 10*3/uL (ref 0.1–1.0)
Monocytes Relative: 11 % (ref 3–12)

## 2011-07-25 MED ORDER — DIPHENOXYLATE-ATROPINE 2.5-0.025 MG PO TABS
1.0000 | ORAL_TABLET | Freq: Four times a day (QID) | ORAL | Status: DC | PRN
  Administered 2011-07-27 (×2): 1 via ORAL
  Filled 2011-07-25 (×2): qty 1

## 2011-07-25 MED ORDER — ALUM & MAG HYDROXIDE-SIMETH 200-200-20 MG/5ML PO SUSP
15.0000 mL | Freq: Two times a day (BID) | ORAL | Status: DC
  Administered 2011-07-25: 15 mL via ORAL
  Filled 2011-07-25: qty 30

## 2011-07-25 MED ORDER — POTASSIUM CHLORIDE CRYS ER 20 MEQ PO TBCR
20.0000 meq | EXTENDED_RELEASE_TABLET | Freq: Every day | ORAL | Status: DC
  Administered 2011-07-25 – 2011-07-30 (×6): 20 meq via ORAL
  Filled 2011-07-25 (×6): qty 1

## 2011-07-25 MED ORDER — DIPHENOXYLATE-ATROPINE 2.5-0.025 MG PO TABS
1.0000 | ORAL_TABLET | Freq: Once | ORAL | Status: AC
  Administered 2011-07-25: 1 via ORAL
  Filled 2011-07-25: qty 1

## 2011-07-25 MED ORDER — PANTOPRAZOLE SODIUM 40 MG IV SOLR
40.0000 mg | INTRAVENOUS | Status: DC
  Administered 2011-07-26 – 2011-07-27 (×2): 40 mg via INTRAVENOUS
  Filled 2011-07-25 (×2): qty 40

## 2011-07-25 MED ORDER — IOHEXOL 300 MG/ML  SOLN
100.0000 mL | Freq: Once | INTRAMUSCULAR | Status: AC | PRN
  Administered 2011-07-25: 100 mL via INTRAVENOUS

## 2011-07-25 NOTE — Progress Notes (Signed)
Subjective: Reviewed chart - episodes of sharp SSCP 12/2 that was brief in nature and relieved by SL NTG. She does have a h/o CAD. There was one more episode of chest pain between 1900-2300 none since. She continues to have frequent loose stools.  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 DAy 4 Zosyn. Appreciated GI's help - bentyl for possible IBS as source of bloating and stools  Plan - reschedule follow-up CT abd pelvis due to continued pain.            Continue bentyl           Will continue lomotil   Essential hypertension, benign - BP stable   CORONARY ATHEROSCLEROSIS NATIVE CORONARY ARTERY - reviewed historical data. Dr. Ludwig Clarks last note from August '12 - she was stable and oding well. He thought a repeat stress tess due next year. Her presentation is atypical for angina and there is no data to support ACS.  Plan - complete cycle of cardiac enzymes.           Make liquid antacids scheduled           Transfer to tele bed.      Casimiro Needle Jahnyla Parrillo 07/25/2011, 7:24 AM

## 2011-07-25 NOTE — Progress Notes (Signed)
Patient seen, exmained and I agree with the above documentation, including the assessment and plan. Pt going for repeat abd CT scan today. Continues with diarrhea, approx 5 times since 7 am today, nonbloody, no melena.  C diff PCR neg on 12/1 On probiotic Will stop Mylanta Advance diet Await CT results, then can resume Lomotil to help with diarrhea Will change PPI to IV once daily to ensure absorption (pt with epigastric vs lower chest pain), unclear etiology.

## 2011-07-25 NOTE — Progress Notes (Signed)
GI MD and attending MD notified that pt had x2 episodes of loose/watery stool.  Dellia Cloud RN

## 2011-07-25 NOTE — Progress Notes (Signed)
Patient ID: [name], female   DOB: 06-21-1930, 77 y.o.   MRN: [id number] [facility] Progress Note  Subjective: [name] was transferred to step down last evening after having a couple episodes of a sharp squeezing substernal chest pain as he did with mild shortness of breath and nausea. Her initial cardiac enzymes are negative. She feels fine this morning and has not had any further chest pain. She says her abdominal pain is actually a little bit better though she is still tender. She  was grateful for the Lomotil order yesterday and her diarrhea has stopped. She was to have a repeat CT scan done yesterday that was delayed because of the chest pain and will be done today.  Objective:  Vital signs in last 24 hours: Temp:  [97.8 F (36.6 C)-98.2 F (36.8 C)] 98 F (36.7 C) (12/03 0740) Pulse Rate:  [64-90] 69  (12/03 0740) Resp:  [11-20] 16  (12/03 0740) BP: (101-138)/(63-81) 133/73 mmHg (12/03 0740) SpO2:  [97 %-100 %] 98 % (12/03 0740) Weight:  [63 kg (138 lb 14.2 oz)-63.6 kg (140 lb 3.4 oz)] 140 lb 3.4 oz (63.6 kg) (12/03 0000) Last BM Date: 07/25/11 General:   Alert,  Well-developed,  white female in NAD Heart:  Regular rate and rhythm; no murmurs Abdomen:  Soft, bowel sounds are present, she remains tender across the lower abdomen particularly in the left lower quadrant with mild guarding no rebound Extremities:  Without edema. Neurologic:  Alert and  oriented x4;  grossly normal neurologically. Psych:  Alert and cooperative. Normal mood and affect.  Intake/Output from previous day: 12/02 0701 - 12/03 0700 In: 1287.5 [I.V.:1200; IV Piggyback:87.5] Out: 953 [Urine:951; Stool:2] Intake/Output this shift: Total I/O In: 400 [I.V.:400] Out: 500 [Urine:500]  Lab Results:  Basename 07/25/11 0130  WBC 4.5  HGB 10.7*  HCT 31.7*  PLT 263   BMET  Basename 07/25/11 0130  NA 138  K 3.3*  CL 108  CO2 23  GLUCOSE 119*  BUN 4*  CREATININE 0.91  CALCIUM  8.2*    Assessment / Plan: #8 77 year old female with recurrent sigmoid diverticulitis with second episode this year requiring hospitalization. Patient with persistent pain, otherwise stable. She is for repeat CT scan today, agree with continuing IV Zosyn. Will advance diet at her request #2 diarrhea associated with her episodes of diverticulitis, improved with Lomotil and bentyll. She may have a diverticulitis/ colitis type picture versus antibiotic induced diarrhea. For C. difficile toxin was negative. #3 history of coronary artery disease status post stent proximally 6 years ago with 3 episodes of substernal chest pain yesterday most consistent with angina, management per Dr. Debby Bud. Active Problems:  Essential hypertension, benign  CORONARY ATHEROSCLEROSIS NATIVE CORONARY ARTERY  Diverticulitis of colon (without mention of hemorrhage)     LOS: 4 days   [name]  07/25/2011, 9:57 AM

## 2011-07-26 NOTE — Progress Notes (Signed)
Nutrition Brief Note:  Per RN, pt with additional questions r/t diet therapy since last visit from RD over the weekend. Pt seen and all questions answered.  Further explanation given along with handout 'Low Fiber Nutrition Therapy/High Fiber Nutrition Therapy.'  Encouraged pt to request a return visit if further questions present.  Pager: [phone]

## 2011-07-26 NOTE — Progress Notes (Signed)
Subjective: Still feels weak but has been up to chair and to bathroom. Continues to have loose stools. Diffuse abdominal tenderness but decreased pain.  Objective: Lab: CBC- WBC 4.5, Hgb 10.7. Bmet -K 3.3, Cr 0.91, CE - x 3  Imaging: CT abd/pelvis 12/3 - resolution of diverticulitis   Physical Exam: Vitals are stable Pulm - normal respirations Cor - RRR.  Tele - NSR Abd - BS+ x 4 quads; diffuse tenderness, little point tenderness LLQ Neuro- A&O x 3     Assessment/Plan: Active Problems: 1.  Diverticulitis of colon (without mention of hemorrhage)decreased symptoms. CT negative. No fever or leukocytosis Plan - D/C zosyn at d/c 12/5           Continue florastor, bentyl, lomotil prn  2.  Essential hypertension, benign - stable BP  3.  CORONARY ATHEROSCLEROSIS NATIVE CORONARY ARTERY - cardiac enzymes neg x 6 this adm  Dispo - home 12/5 if continues to do well. Will d/c/ tele. IV to SL ambualte ad lib     [name] 07/26/2011, 8:16 AM

## 2011-07-26 NOTE — Progress Notes (Signed)
[facility] Progress Note  Subjective: Diarrhea and abdominal pain better this am.  Objective:  Vital signs in last 24 hours: Temp:  [98.3 F (36.8 C)-98.6 F (37 C)] 98.6 F (37 C) (12/04 0550) Pulse Rate:  [72-83] 75  (12/04 0550) Resp:  [16] 16  (12/04 0550) BP: (117-173)/(71-90) 117/71 mmHg (12/04 0550) SpO2:  [94 %-98 %] 95 % (12/04 0550) Weight:  [62.143 kg (137 lb)] 137 lb (62.143 kg) (12/03 1300) Last BM Date: 07/25/11 General:   Alert,  white female in NAD Heart:  Regular rate and rhythm Abdomen:  Soft, nondistended. Diffuse abdominal tenderness (moderate) without rebound or guarding. Normal bowel sounds.   Extremities:  Without edema. Neurologic:  Alert and  oriented x4;  grossly normal neurologically. Psych:  Alert and cooperative. Normal mood and affect.  Lab Results:  Basename 07/25/11 0130  WBC 4.5  HGB 10.7*  HCT 31.7*  PLT 263   BMET  Basename 07/25/11 0130  NA 138  K 3.3*  CL 108  CO2 23  GLUCOSE 119*  BUN 4*  CREATININE 0.91  CALCIUM 8.2*    Studies/Results: Ct Abdomen Pelvis W Contrast  07/25/2011  *RADIOLOGY REPORT*  Clinical Data: Diverticulitis.  Nausea.  Diarrhea.  CT ABDOMEN AND PELVIS WITH CONTRAST  Technique:  Multidetector CT imaging of the abdomen and pelvis was performed following the standard protocol during bolus administration of intravenous contrast.  Contrast: OMNIPAQUE IOHEXOL 300 MG/ML IV SOLN  Comparison: 11/29 and 09/27/2010  Findings: The diverticulitis in the proximal sigmoid portion of the colon seen on the prior exam has resolved.  The patient has extensive diverticulosis of that area but there are no residual inflammatory changes.  No adenopathy.  The patient has chronic biliary ductal dilatation with the common bile duct diameter of 12 mm.  However, the patient's bilirubin is not elevated.  Gallbladder has been removed.  Liver parenchyma, spleen, adrenal glands, and kidneys demonstrate no significant  abnormalities.  Pancreatic duct is slightly dilated, unchanged since 09/27/2010.  No free air or free fluid in the abdomen or pelvis.  Lung bases are clear.  No acute osseous abnormality.  Fairly severe osteoarthritis of the right hip.  Old L2 compression fracture treated with vertebroplasty.  Uterus and ovaries appear to have been removed.  IMPRESSION: Resolution of the diverticulitis in the left lower quadrant.  No acute abnormalities.  Original Report Authenticated By: Gwynn Burly, M.D.     Assessment / Plan: 11. 77 year old female with recurrent sigmoid diverticulitis. WBC normal, repeat CT scan yesterday shows resolution of diverticulitis. Today is 5th of Zosyn. She is tolerating a low fiber diet. Today is my first time seeing her so I do not have a baseline but patient has diffuse abdominal tenderness. Patient does state she is less tender than when admitted however. See #2. Hopefully home soon on oral antibiotics (will need to find good regimen as she is allergic to Augmentin) 2. Diarrhea, improved with QID Lomotil. Patient does get diarrhea with antibiotics and  C-Diff negative several days ago. Low threshold for repeating C-Diff PCR if diarrhea worsens. I have cautioned her about potential for constipation with QID Lomotil and we decrease frequency depending on her clinical course. Continue probiotics.     LOS: 5 days   Willette Cluster  07/26/2011, 10:30 AM

## 2011-07-26 NOTE — Progress Notes (Signed)
Patient seen and I agree with the above documentation, including the assessment and plan. Overall, diarrhea has improved, question if related, in part, to oral contrast.  Also likely related in part to abx. Diverticulitis has resolved by CT, which is very reassuring. Can cont lomotil for diarrhea.  If worsens, would repeat C diff PCR given recent abx exposure. Would cont probiotics  Primary team to transition to oral abx to complete course. Call with questions (GI will sign off for now)

## 2011-07-26 NOTE — Progress Notes (Addendum)
CSW visited with the pt and completed psychosocial assessment (pls see shadow chart). Pt has resided at [facility] independent living facility for the past 12 years and she plans to return to her apartment at discharge. Pt reports she has support from her two daughter who live here in [place]. Pt plans to have one of her daughters transport her home at discharge. Pt very pleasant and expressed her appreciation for how well the hospital staff has treated her during this hospitalization. CSW has called Janie at the facility and left a voice mail for a return call back just to see if there are any questions and concerns about the pts plans to discharge back to the facility. CSW signing off. Please re-consult if needed. Golden Pop 07/26/2011 10:46 AM [phone]   CSW received a phone call from [facility] at the facility. CSW answered questions about possible discharge date and whether of not the pt would need a higher level of care at discharge. CSW explained that at the time it is possible the pt will discharge tomorrow and a higher level of care has not been recommended for the pt. CSW has signed off on pt earlier. Please re-consult if needed. Patrice Paradise, LCSWA 07/26/2011 12:18 PM [phone]

## 2011-07-27 ENCOUNTER — Ambulatory Visit: Payer: Medicare Other

## 2011-07-27 MED ORDER — PANTOPRAZOLE SODIUM 40 MG PO TBEC
40.0000 mg | DELAYED_RELEASE_TABLET | Freq: Every day | ORAL | Status: DC
  Administered 2011-07-27 – 2011-07-29 (×3): 40 mg via ORAL
  Filled 2011-07-27 (×3): qty 1

## 2011-07-27 NOTE — Progress Notes (Signed)
[facility] Progress Note  Subjective: No significant abdominal pain. Main complaints include nausea, diarrhea and weakness. Can eat as long as she takes anti-emetic  Objective:  Vital signs in last 24 hours: Temp:  [97.9 F (36.6 C)-98.2 F (36.8 C)] 98.2 F (36.8 C) (12/05 0524) Pulse Rate:  [79-86] 79  (12/05 0524) Resp:  [16-18] 16  (12/05 0524) BP: (104-129)/(65-81) 110/65 mmHg (12/05 0524) SpO2:  [95 %-96 %] 96 % (12/05 0524) Last BM Date: 07/27/11 General:   Alert, white female in NAD Heart:  Regular rate and rhythm Abdomen:  Soft,nondistended. Mild - moderate diffuse lower abdominal tenderness (less than yesterday). Normal bowel sounds, without guarding, and without rebound.   Extremities:  Without edema. Neurologic:  Alert and  oriented x4;  grossly normal neurologically. Psych:  Alert and cooperative. Normal mood and affect.   Assessment / Plan: 72. 77 year old female with recurrent sigmoid diverticulitis.  She had a 9 days of Cipro/Flagyl as outpatient and 5 days of Zosyn. Her antibiotics have now been discontinued. CTscan shows resolution of diverticulitis.    2. Diarrhea, not improving. Has had 4 loose stools since 8:30am. Patient does get diarrhea with antibiotics and C-Diff negative several days ago. She has order for Lomotil qid but is taking it only once daily. Her last dose was round midnight this morning and it doesn't look like she had any yesterday. For extra precaution a repeat C-Diff was ordered by primary team as patient is at risk for C-Diff. Continue probiotics.   3. Nausea, etiology? Not clear at this point but maybe related to antibiotics. If this is the case her nausea should subside since antibiotics have been discontinued.     LOS: 6 days   Willette Cluster  07/27/2011, 11:03 AM

## 2011-07-27 NOTE — Progress Notes (Signed)
I agree with the above documentation, including the assessment and plan.  

## 2011-07-27 NOTE — Progress Notes (Signed)
Patient meets P and T Committee Criteria for auto conversion of IV proton pump inhibitor  to PO: 1. Tolerating diet and other po meds.  2. No GIB.  Will change proton pump inhibitor to po per P and T Committee policy.

## 2011-07-27 NOTE — Progress Notes (Signed)
Subjective: Had a good day yesterday but has had recurrent copious watery stool, increase in abdominal pain and nausea.  Objective: Lab: no new lab  Imaging: no new iaging   Physical Exam: Vitals stable- no fever, BP ok Gen'l- mildly uncomfortable white woman Cor - 2+ radial pulse Abd - BS+, no guarding or rebound, diffusely tender w/o point tenderness at LLQ     Assessment/Plan: Active Problems:  1. Diverticulitis of colon (without mention of hemorrhage) no fever, decreased LLQ pain clear on CT. She has had 9 days of cipro/flagy and 5 days Zosyn. Plan - D/C antibiotics           Continue florastor and lomotil and bentyl           Recheck stool for C.Diff.   2. Essential hypertension, benign stable  3.  CORONARY ATHEROSCLEROSIS NATIVE CORONARY ARTERY - stable  Dispo - will delay discharge until patient more stable     Illene Regulus 07/27/2011, 8:51 AM

## 2011-07-28 LAB — CLOSTRIDIUM DIFFICILE BY PCR: Toxigenic C. Difficile by PCR: NEGATIVE

## 2011-07-28 LAB — CREATININE, SERUM: Creatinine, Ser: 0.97 mg/dL (ref 0.50–1.10)

## 2011-07-28 MED ORDER — DEXTROSE-NACL 5-0.45 % IV SOLN
INTRAVENOUS | Status: DC
  Administered 2011-07-28 – 2011-07-30 (×4): via INTRAVENOUS

## 2011-07-28 NOTE — Progress Notes (Signed)
Patient seen and I agree with the above documentation, including the assessment and plan. Loose stools likely related to senna, but will exclude c diff given her recent abx. If diarrhea does not improve, then colonoscopic eval is very reasonable. We will follow

## 2011-07-28 NOTE — Progress Notes (Signed)
[facility] Progress Note  Subjective: Still nauseated. Has had 3 BMs since 8am (pudding consistency). Having some diffuse lower abdominal discomfort.   Objective:  Vital signs in last 24 hours: Temp:  [98.3 F (36.8 C)-98.5 F (36.9 C)] 98.5 F (36.9 C) (12/06 0532) Pulse Rate:  [83-89] 86  (12/06 0532) Resp:  [16-18] 18  (12/06 0532) BP: (112-131)/(68-84) 112/72 mmHg (12/06 0532) SpO2:  [96 %] 96 % (12/06 0532) Last BM Date: 07/28/11 General:   Alert,  white female in NAD Heart:  Regular rate and rhythm Abdomen:  Soft, nondistended. Mild to moderate diffuse lower abdominal tenderness. Normal bowel sounds, without guarding, and without rebound.   Neurologic:  Alert and  oriented x4;  grossly normal neurologically. Psych:  Alert and cooperative. Normal mood and affect.   Assessment / Plan: 40. 77 year old female with recurrent sigmoid diverticulitis, s/p 14 total days of antibiotics. CTscan shows resolution of diverticulitis.   2. Increased frequency of stool. Stools not watery but unformed. C-diff yesterday canceled per protocol. I spoke to lab and will be sent out today at 5pm with results later this evening. This doesn't seem like C-Diff as stools aren't really loose and her WBC is normal. I see that patient got Senokot yesterday morning and also this morning. I am sure that is contributing of increased BMs. Will discontinue Senokot. If altered bowel habits persist despite discontinuation of Senokot she will need colonoscopy (could be done outpatient). Regardless, she needs one at some point because her last one was 20 years ago.  3. Nausea, etiology? I reviewed meds and nothing stands as culprit. No antibiotics since yesterday. She may need eventual EGD. Continue Zofran, PPI      LOS: 7 days   Willette Cluster  07/28/2011, 9:58 AM

## 2011-07-28 NOTE — Progress Notes (Signed)
Subjective: Feels weak and has diffuse abdominal pain. She continues to have copious stools - 6 to 10 daily. She reports formed fragments. She also has recurrent nausea and has decreased PO intake  Objective: Lab: no new lab  Imaging: nonew imaging   Physical Exam: Vitals reviewed - no fever Gen'l - worn appearing Cor- RRR Abd- + BSx 4 quadrants, diffuse abdominal pain to light palpation Psych - she feels calmer     Assessment/Plan: 1. GI - diverticulitis resolved by CT criteria as well as absence of fever, no leukocytosis. Main problem is diarrhea. C. Diff ordered yesterday cancel per lab protocol! No blood or mucus reported in stool. Plan- per GI: will ask the GI service to order appropriate lab, e.g. Lactoferrin, routine cultures, C.Diff, other           Lomotil will be stopped           Resume IVF due to GI fluid losses and nausea  2,3- stable   Illene Regulus 07/28/2011, 8:45 AM

## 2011-07-29 ENCOUNTER — Telehealth: Payer: Self-pay | Admitting: Nurse Practitioner

## 2011-07-29 LAB — BASIC METABOLIC PANEL
BUN: 6 mg/dL (ref 6–23)
CO2: 25 mEq/L (ref 19–32)
Calcium: 8.7 mg/dL (ref 8.4–10.5)
Chloride: 105 mEq/L (ref 96–112)
Creatinine, Ser: 0.89 mg/dL (ref 0.50–1.10)
Glucose, Bld: 107 mg/dL — ABNORMAL HIGH (ref 70–99)

## 2011-07-29 LAB — CBC
HCT: 35.5 % — ABNORMAL LOW (ref 36.0–46.0)
MCH: 30 pg (ref 26.0–34.0)
MCHC: 33.5 g/dL (ref 30.0–36.0)
MCV: 89.4 fL (ref 78.0–100.0)
Platelets: 325 10*3/uL (ref 150–400)
RDW: 13.1 % (ref 11.5–15.5)
WBC: 4.5 10*3/uL (ref 4.0–10.5)

## 2011-07-29 NOTE — Telephone Encounter (Signed)
Patient is scheduled with Willette Cluster RNP for 08/03/11 at 10:00

## 2011-07-29 NOTE — Progress Notes (Signed)
Nutrition Follow Up  Diet: Low fiber, intake 100% of breakfast per pt statement  - Pt reports improvement in diarrhea, only 3 so far today per pt report. Pt reports improvement in nausea after Zofran shot yesterday. Pt negative for C. Difficile. Noted pt on Florastor.   Scheduled Meds:   . antiseptic oral rinse  15 mL Mouth Rinse BID  . aspirin  81 mg Oral Daily  . dicyclomine  10 mg Oral BID  . enoxaparin  40 mg Subcutaneous Q24H  . lisinopril  20 mg Oral Daily  . loratadine  10 mg Oral Daily  . pantoprazole  40 mg Oral Q1200  . potassium chloride  20 mEq Oral Daily  . rosuvastatin  5 mg Oral Daily  . saccharomyces boulardii  250 mg Oral BID  . sertraline  50 mg Oral Daily   Continuous Infusions:   . dextrose 5 % and 0.45% NaCl 75 mL/hr at 07/29/11 0600   PRN Meds:.acetaminophen, acetaminophen, albuterol, nitroGLYCERIN, ondansetron (ZOFRAN) IV, ondansetron, traMADol, zolpidem  CMP     Component Value Date/Time   NA 136 07/29/2011 0340   K 3.7 07/29/2011 0340   CL 105 07/29/2011 0340   CO2 25 07/29/2011 0340   GLUCOSE 107* 07/29/2011 0340   BUN 6 07/29/2011 0340   CREATININE 0.89 07/29/2011 0340   CALCIUM 8.7 07/29/2011 0340   PROT 7.7 07/21/2011 1719   ALBUMIN 3.6 07/21/2011 1719   AST 18 07/21/2011 1719   ALT 14 07/21/2011 1719   ALKPHOS 54 07/21/2011 1719   BILITOT 0.3 07/21/2011 1719   GFRNONAA 60* 07/29/2011 0340   GFRAA 70* 07/29/2011 0340    Intake/Output Summary (Last 24 hours) at 07/29/11 1419 Last data filed at 07/29/11 1411  Gross per 24 hour  Intake 2008.5 ml  Output      0 ml  Net 2008.5 ml   NUTRITION DIAGNOSIS:  -Inadequate oral intake - improving  MONITORING/EVALUATION(Goals):  1. Resolution of diarrhea - not met but improving. 2. Pt to consume >75% of meals - met with breakfast.  Plan - Pt eating better. Gave pt vanilla Ensure to try for additional nutrition. Encouraged increased intake. No educational needs. Will monitor  Dietitian # [phone]

## 2011-07-29 NOTE — Plan of Care (Signed)
Problem: Inadequate Intake (NI-2.1) Goal: Food and/or nutrient delivery Individualized approach for food/nutrient provision.  Outcome: Progressing Pt ate 100% of breakfast, requires additional time to eat, eats slowly.

## 2011-07-29 NOTE — Progress Notes (Signed)
[facility] Progress Note  Subjective: Feels better today, eating some applesauce. Still having frequent stools "pudding consistency" . Complains of weakness.  Objective:  Vital signs in last 24 hours: Temp:  [98 F (36.7 C)] 98 F (36.7 C) (12/07 0625) Pulse Rate:  [76-78] 77  (12/07 0625) Resp:  [16-18] 18  (12/07 0625) BP: (118-142)/(71-78) 118/72 mmHg (12/07 0625) SpO2:  [96 %-97 %] 96 % (12/07 0625) Last BM Date: 07/29/11 General:   Pleasant white female in NAD Heart:  Regular rate and rhythm Abdomen:  Soft, nondistended, mild diffuse lower abdominal tenderness, R>L. Also has moderate RUQ tenderness. Normal bowel sounds, without guarding, and without rebound.   Extremities:  Without edema. Neurologic:  oriented x4;  grossly normal neurologically. Psych:  Alert and cooperative. Normal mood and affect.   Lab Results:  [facility] 07/29/11 0340  WBC 4.5  HGB 11.9*  HCT 35.5*  PLT 325   BMET  Basename 07/29/11 0340 07/28/11 0435  NA 136 --  K 3.7 --  CL 105 --  CO2 25 --  GLUCOSE 107* --  BUN 6 --  CREATININE 0.89 0.97  CALCIUM 8.7 --    Assessment / Plan: 65.  77 year old female with recurrent sigmoid diverticulitis, resolved. 2. Increased frequency of stool. Stools not watery but unformed. C-diff yesterday and on 07/23/11 were negative.  WBC is normal. Her last Senokot was yesterday. Recommending restarting anti-diarrheal, Immodium will probably suffice. Will follow up with her in the office. She will need outpatient colonoscopy given that her last one was 20 years ago and she has had at least two episodes of diverticulitis this year. .  3. Nausea, improved today. She has eaten 1/2 bagel and is working on some applesauce. Continue PPI and prn anti-emetics  If nausea doesn't continue to improve she may need outpatient EGD at time of colonoscopy.  4. Weakness. Hopefully this will improve with oral intake and also recommend ambulating in halls with assist.      LOS: 8 days   Willette Cluster  07/29/2011, 11:49 AM

## 2011-07-29 NOTE — Progress Notes (Signed)
Subjective: Sitting up in a chair. She states that she feels better, but still weak. She has had 3 BM's in the last hour -formed but soft. She has been able to eat small amounts.  Objective: Lab: WBC 4.5, Hgb 11.9         Bmet -K 3.7, Cr 0.89  Imaging:no new imaging  Physical Exam: Vitals reviewed - stable, afebril Pulm - normal respirations Abd- BS+, soft, no guarding or rebound, no marked tenderness     Assessment/Plan: 1. GI - Diverticulitis resolved. Frequent loose stools - C. Diff negative x 2. Senekot stopped. Nausea improved. Plan- advance diet today          Continue IVF  Dispo - home in AM if stable   Illene Regulus 07/29/2011, 12:27 PM

## 2011-07-30 MED ORDER — DIPHENOXYLATE-ATROPINE 2.5-0.025 MG PO TABS: 1.0000 | ORAL_TABLET | Freq: Four times a day (QID) | ORAL | Status: AC | PRN

## 2011-07-30 MED ORDER — SERTRALINE HCL 50 MG PO TABS: ORAL_TABLET | ORAL | Status: DC

## 2011-07-30 MED ORDER — DICYCLOMINE HCL 10 MG PO CAPS: ORAL_CAPSULE | ORAL | Status: DC

## 2011-07-30 MED ORDER — ONDANSETRON HCL 4 MG PO TABS: 4.0000 mg | ORAL_TABLET | Freq: Four times a day (QID) | ORAL | Status: AC | PRN

## 2011-07-30 MED ORDER — SACCHAROMYCES BOULARDII 250 MG PO CAPS: 250.0000 mg | ORAL_CAPSULE | Freq: Two times a day (BID) | ORAL | Status: AC

## 2011-07-30 NOTE — Progress Notes (Signed)
Patient is medically stable and ready for discharge to home.  Dictation # [id number]

## 2011-07-30 NOTE — Discharge Summary (Signed)
[name], [name]            ACCOUNT NO.:  [id number]  MEDICAL RECORD NO.:  [id number]  LOCATION:  1333                         FACILITY:  [facility]  PHYSICIAN:  Rosalyn Gess. Elgin Carn, MD  DATE OF BIRTH:  Oct 25, 1929  DATE OF ADMISSION:  07/21/2011 DATE OF DISCHARGE:  07/30/2011                              DISCHARGE SUMMARY   ADMITTING DIAGNOSES: 1. Diverticulitis. 2. Chest pressure.  DISCHARGE DIAGNOSES: 1. Diverticulitis, resolved. 2. Irritable bowel syndrome with chronic bowel dysfunction. 3. Cardiac enzymes negative x6.  CONSULTANTS:  Dr. Erick Blinks, GI service.  PROCEDURES:  CT scan of the abdomen and pelvis on November, 29, at time of admission, which showed acute diverticulitis involving the proximal sigmoid colon with focal soft tissue inflammation, trace associated fluid with no evidence of perforation or abscess.  Diffuse diverticulosis in the descending and proximal sigmoid colon.  Small bilateral parapelvic renal cyst noted.  Scattered calcifications along the abdominal aorta and its branches.  Status post vertebroplasty at L3 was noted.  Neck CT scan performed on July 25, 2011, read out as showing resolution of diverticulitis in the left lower quadrant with no acute abnormalities.  EKGs were performed on multiple occasions to this admission on November 29, December 2, and December 2, which showed a question of old injury that was stable with no acute changes noted.  HISTORY OF PRESENT ILLNESS:  Ms. Finchum is a very pleasant 77 year old woman who was seen in the office on July 12, 2011, for left lower quadrant abdominal pain, diagnosed with diverticulitis and treated as an outpatient with Cipro and Flagyl.  Over the succeeding days, she felt increasingly sick with persistent abdominal pain, anorexia, nausea without emesis.  She came progressively weak and bed bound.  Her friend and neighbor became concerned, called the office and she was seen in the office  on the day of admission.  At that time, she was orthostatics with a supine blood pressure 118/72, sitting blood pressure 104/80, standing blood pressure 90/62.  On examination, she had significant abdominal tenderness that was diffuse, but worse in the left lower quadrant.  She also complains of chest discomfort.  She was admitted for IV hydration, IV antibiotics, and rule out for MI.  Please see the H and P for past medical history, family history, and social history.  HOSPITAL COURSE: 1. GI.  The patient was admitted with diverticulitis.  She was started     on IV Zosyn.  On this regimen, she showed improvement with     resolution of diverticulitis by CT scan.  The patient had     persistent abdominal pain, frequent loose, but soft and formed     stools, and anorexia.  She was seen in consultation by the GI     service.  It was thought she might have a degree of irritable bowel     syndrome, as responsible for many of her symptoms.  The patient's     chemistries were checked during this hospitalization.  She     initially had some low-potassium, but her electrolytes did remain     stable.  Renal function remained stable.  Liver functions were     checked at admission and  were at normal range.  The patient did     have frequent loose stools.  She had C. difficile checked x2, it     was negative.  It was felt that the patient did not have true     diarrhea, but just frequent loose stools.  She initially was     treated with Lomotil.  She had been treated with Senokot as a     standing PRN order and this was discontinued.  It was felt the     patient was stable in regard to bowel function and was recommended     that she use either Imodium or Lomotil as an outpatient.  She was     started on Bentyl this admission and this may be continued to use     on a p.r.n. basis for abdominal bloating, discomfort and change     bowel habit.  The patient had a problem with persistent nausea      without emesis.  She was treated with Zofran as needed.  She had no     obvious GI pathology as a cause.  It was recommended that if she     continued to have significant problems, she might would benefit     from esophageal gastroduodenoscopy.  It was recommended as an     outpatient, the patient undergo colonoscopy since her last exam was     20 years ago, and this will be part of routine screening.  With the     patient diverticulitis being resolved with the absence of diarrhea,     having regained some appetite at this time, she is ready for     discharge to home, to continue her convalescence.  No further     antibiotics required.  She will continue on Bentyl and Lomotil. 2. Cardiovascular.  The patient did complain of some mild chest     discomfort on multiple occasions.  She did have several episodes of     very sharp chest pain.  The patient did have cardiac enzymes cycled     x6 that were all negative.  Serial EKGs showed no acute changes.     The patient's history had been reviewed and she has had     catheterizations in 2004 which showed no obstructive disease.  The     patient has had a stress test in the recent past, which was     unremarkable for any significant obstruction or flow abnormalities.     Felt the patient was stable from cardiac perspective, and required     no additional evaluation this admission.  With the patient being     stable at this point, she is ready for discharge to home.  She will     be seen in the office in followup in 5 days or sooner as needed.     She is advised to husband her energies that she could expect at     least a 2-week convalescence.  She should try to keep down food and     fluids as tolerated.  MEDICATIONS:  The patient will resume all of her home medications. Additional medications will include Lomotil and Bentyl as described above.  DISCHARGE EXAMINATION:  VITAL SIGNS:  Temperature was 98.1, blood pressure 125/74, heart rate 66,  respirations were 16, O2 sats 96% on room air. GENERAL APPEARANCE:  This is an older woman lying in bed who is in no acute distress. HEENT:  Conjunctivae  and sclerae were clear.  Pupils equal, round, reactive. NECK:  Supple. CHEST:  The patient is moving air well with no increased work of respiration.  No wheezes or rhonchi are noted. CARDIOVASCULAR:  2+ radial pulse.  Precordium was quiet.  She had a regular rate and rhythm. ABDOMEN:  The patient has positive bowel sounds in all 4 quadrants.  She has diffuse mild tenderness.  She had no acute tenderness on palpation. There is no organosplenomegaly. NEUROLOGIC:  The patient is awake, alert.  She is oriented to person, place, time, and context, and her exam is nonfocal.  LABORATORY DATA:  From December 7; white count was 4500, hemoglobin 11.9 g, platelet count 325,000.  Chemistries with a sodium was 136, potassium was 3.7, chloride 105, CO2 of 25, BUN of 6, creatinine 0.89.  Cardiac enzymes were cycled and negative x6.  AST at admission was 18, ALT was 14, total protein was 7.7.  Lipase was normal at 33, bilirubin was less than 0.1.  Total bilirubin was 0.3.  IMAGING STUDIES:  As above.  DISPOSITION:  The patient should be returned to [facility].  She will be seen on Thursday, on December 13 or sooner on an as-needed basis.  The patient's condition at time of discharge dictation is medically stable.     Rosalyn Gess Dafina Suk, MD     MEN/MEDQ  D:  07/30/2011  T:  07/30/2011  Job:  [id number]

## 2011-08-03 ENCOUNTER — Ambulatory Visit: Payer: Medicare Other | Admitting: Nurse Practitioner

## 2011-08-04 ENCOUNTER — Ambulatory Visit (INDEPENDENT_AMBULATORY_CARE_PROVIDER_SITE_OTHER): Payer: Medicare Other | Admitting: Nurse Practitioner

## 2011-08-04 ENCOUNTER — Encounter: Payer: Self-pay | Admitting: Nurse Practitioner

## 2011-08-04 DIAGNOSIS — K5732 Diverticulitis of large intestine without perforation or abscess without bleeding: Secondary | ICD-10-CM

## 2011-08-04 DIAGNOSIS — K5792 Diverticulitis of intestine, part unspecified, without perforation or abscess without bleeding: Secondary | ICD-10-CM

## 2011-08-05 ENCOUNTER — Encounter: Payer: Self-pay | Admitting: Nurse Practitioner

## 2011-08-05 DIAGNOSIS — K5792 Diverticulitis of intestine, part unspecified, without perforation or abscess without bleeding: Secondary | ICD-10-CM

## 2011-08-05 HISTORY — DX: Diverticulitis of intestine, part unspecified, without perforation or abscess without bleeding: K57.92

## 2011-08-05 NOTE — Progress Notes (Addendum)
[name] [id number] [birth year]   HISTORY OR PRESENT ILLNESS :  Patient is a 77 year female recently hospitalized for sigmoid diverticulitis refractory to out patient management by PCP. This was at least her second documented episode. She received several days of Zosyn, a repeat CTscan during that admission revealed resolution of the diverticulitis. Her main issues were nausea and diarrhea, both felt to be secondary to antibiotics. C-diff PCR was negative x 2. Prior to to discharge her nausea and diarrhea had improved but not totally resolved. Patient here in follow up today. Her nausea and diarrhea have totally resolved. She mainly feels weak.  Past Medical History  Diagnosis Date  . BREAST CANCER, HX OF 03/16/2007  . PEPTIC ULCER DISEASE 03/16/2007  . IRRITABLE BOWEL SYNDROME, HX OF 03/16/2007  . DEGENERATIVE JOINT DISEASE, CERVICAL SPINE 03/16/2007  . HYPERLIPIDEMIA 05/18/2007  . ANXIETY DEPRESSION 08/25/2009  . Essential hypertension, benign 10/28/2008  . CORONARY ATHEROSCLEROSIS NATIVE CORONARY ARTERY 10/28/2008  . CAROTID ARTERY STENOSIS 10/28/2008  . Chronic rhinitis 09/17/2008  . GERD 10/10/2007  . Diverticulitis of colon (without mention of hemorrhage) 03/16/2007  . CAD (coronary artery disease) 2005    s/p DES to LCX  . Cystitis   . Palpitations    Past Surgical History  Procedure Date  . Reconstruction breast w/ tram flap     right  . Hernia repair 2002    ventral hernia  . Finger arthroplasty 2002    Right thumb  . Abdominal hysterectomy   . Cholecystectomy   . Mastectomy   . Oophorectomy   . Varicose vein surgery     Current Medications, Allergies, Family History and Social History were reviewed in [employer] record.  PHYSICAL EXAMINATION : General: White female in no acute distress Head: Normocephalic and atraumatic Eyes:  sclerae anicteric,conjunctive pink. Ears: Normal auditory acuity Neck: Supple, no masses.  Lungs: Clear throughout  to auscultation Heart: Regular rate and rhythm Abdomen: Soft, nondistended, mild diffuse tenderness. No masses or hepatomegaly noted. Normal bowel sounds Rectal: not done Musculoskeletal: Symmetrical with no gross deformities  Skin: No lesions on visible extremities Extremities: No edema or deformities noted Neurological: Oriented x 4, grossly nonfocal Cervical Nodes:  No significant cervical adenopathy Psychological:  Alert and cooperative. Normal mood and affect  ASSESSMENT AND PLAN : Recurrent sigmoid diverticulitis, resolved. Patient's last colonoscopy was 15-20 years ago. In light of at least two episodes of diverticulitis this year patient should have colonoscopy to exclude underlying neoplasm. Patient still feels weak from recent hospitalization and prefers to wait a month or so. She will contact us and schedule procedure toward the end of January 2013. Patient saw Dr. Christella Hartigan once in 2010 and had an EGD. She saw Dr. Marina Goodell in consult this past admission and would like to come under his care. I will forward this request to Drs. Marina Goodell and Christella Hartigan.

## 2011-08-10 ENCOUNTER — Ambulatory Visit: Payer: Medicare Other

## 2011-08-10 NOTE — Progress Notes (Signed)
NO A AND PLAN HERE.Marland KitchenMarland Kitchen

## 2011-08-21 NOTE — Progress Notes (Signed)
I don't fell strongly either way, but I'm happy to help if that's the patient's preference and ok w/ Dr Christella Hartigan

## 2011-08-22 ENCOUNTER — Other Ambulatory Visit: Payer: Self-pay | Admitting: Cardiology

## 2011-08-22 MED ORDER — LISINOPRIL 20 MG PO TABS: 20.0000 mg | ORAL_TABLET | Freq: Every day | ORAL | Status: DC

## 2011-08-22 NOTE — Progress Notes (Signed)
It is Ok with me if she wants to switch care to Dr. Marina Goodell

## 2011-08-25 ENCOUNTER — Ambulatory Visit: Payer: Medicare Other | Admitting: Internal Medicine

## 2011-08-25 ENCOUNTER — Ambulatory Visit (INDEPENDENT_AMBULATORY_CARE_PROVIDER_SITE_OTHER): Payer: Medicare Other | Admitting: Internal Medicine

## 2011-08-25 ENCOUNTER — Encounter: Payer: Self-pay | Admitting: Internal Medicine

## 2011-08-25 DIAGNOSIS — K5732 Diverticulitis of large intestine without perforation or abscess without bleeding: Secondary | ICD-10-CM

## 2011-08-25 DIAGNOSIS — K219 Gastro-esophageal reflux disease without esophagitis: Secondary | ICD-10-CM

## 2011-08-25 DIAGNOSIS — I10 Essential (primary) hypertension: Secondary | ICD-10-CM

## 2011-08-28 NOTE — Assessment & Plan Note (Signed)
Resolved. With two bouts in recent time - agree with the need for colonoscopy f/o other pathology.

## 2011-08-28 NOTE — Assessment & Plan Note (Signed)
Had c/p while in hospital - ruled out for cardiac involvement, thus most likely GERD. Since discharge she has been doing better.   Plan- will continue PPI in AM, H2 blocker in PM and prn Bentyl.

## 2011-08-28 NOTE — Progress Notes (Signed)
  Subjective:    Patient ID: [name], female    DOB: [birth year], 78 y.o.   MRN: [id number]  HPI Mr.s Schenk was recently admitted 11/29 - 12/08 for diverticulitis along with IBS symptoms. She saw Lucrezia Europe, [place] Dec 13th for follow-up and was doing well, just weak. A colonoscopy has been recommended. In the interval she has done well. She stayed in town over [place]. She has been regaining strength. She had an epiphany during her hospitalization: "she needs to start doing things for [name]" thus she was able to enjoy staying home and having a quiet holiday season. She is regaining strength.  I have reviewed the patient's medical history in detail and updated the computerized patient record.     Review of Systems System review is negative for any constitutional, cardiac, pulmonary, GI or neuro symptoms or complaints other than as described in the HPI.     Objective:   Physical Exam Filed Vitals:   08/25/11 1646  BP: 102/72  Pulse: 112  Temp: 97.5 F (36.4 C)  Resp: 14   WNWD white woman in no distress HEENT- C&S clear Cor - RRR Abd - BS+, soft, no guarding or rebound Neuro/psych-  A&O x 3, CN II-XII, normal, gait normal, mood bright.       Assessment & Plan:

## 2011-08-28 NOTE — Assessment & Plan Note (Signed)
BP Readings from Last 3 Encounters:  08/25/11 102/72  08/04/11 102/60  07/30/11 125/74   OK - will watch for persistent hypotension.

## 2011-08-31 ENCOUNTER — Ambulatory Visit
Admission: RE | Admit: 2011-08-31 | Discharge: 2011-08-31 | Disposition: A | Discharge status: Home / Self Care | Payer: Medicare Other | Source: Ambulatory Visit | Attending: Internal Medicine | Admitting: Internal Medicine

## 2011-08-31 DIAGNOSIS — Z1231 Encounter for screening mammogram for malignant neoplasm of breast: Secondary | ICD-10-CM

## 2011-09-01 ENCOUNTER — Telehealth: Payer: Self-pay | Admitting: Internal Medicine

## 2011-09-02 NOTE — Telephone Encounter (Signed)
Pt was seen in December by Willette Cluster NP following hospitalization. In the OV note it states that the pt was to call back in Jan for colonoscopy. Pt is calling stating that her pcp, Dr. Debby Bud, would like for her to have an endo at the same time. Last endo was done in 2010 by Dr. Christella Hartigan. The OV note by Dr. Debby Bud does not mention and egd. Dr. Marina Goodell please advise.

## 2011-09-02 NOTE — Telephone Encounter (Signed)
Okay. Add EGD to evaluate abdominal pain

## 2011-09-02 NOTE — Telephone Encounter (Signed)
Per pt she is still having pain in her stomach and nausea.

## 2011-09-02 NOTE — Telephone Encounter (Signed)
Pt scheduled for previsit for 09/13/11@3 :30pm. Propofol ECL scheduled for 09/21/11. Pt aware of appt dates and times.

## 2011-09-02 NOTE — Telephone Encounter (Signed)
What problem is she having that might warrant an EGD?

## 2011-09-06 ENCOUNTER — Ambulatory Visit: Payer: Medicare Other | Admitting: Internal Medicine

## 2011-09-13 ENCOUNTER — Ambulatory Visit (AMBULATORY_SURGERY_CENTER): Payer: Medicare Other

## 2011-09-13 VITALS — Ht 64.0 in | Wt 140.1 lb

## 2011-09-13 DIAGNOSIS — R110 Nausea: Secondary | ICD-10-CM

## 2011-09-13 DIAGNOSIS — R1013 Epigastric pain: Secondary | ICD-10-CM

## 2011-09-13 DIAGNOSIS — Z8719 Personal history of other diseases of the digestive system: Secondary | ICD-10-CM

## 2011-09-13 MED ORDER — PEG-KCL-NACL-NASULF-NA ASC-C 100 G PO SOLR: 1.0000 | Freq: Once | ORAL | Status: AC

## 2011-09-19 DIAGNOSIS — K222 Esophageal obstruction: Secondary | ICD-10-CM | POA: Insufficient documentation

## 2011-09-21 ENCOUNTER — Ambulatory Visit (AMBULATORY_SURGERY_CENTER): Payer: Medicare Other | Admitting: Internal Medicine

## 2011-09-21 ENCOUNTER — Encounter: Payer: Self-pay | Admitting: Internal Medicine

## 2011-09-21 VITALS — BP 205/96 | HR 109 | Temp 95.4°F | Resp 20 | Ht 64.0 in | Wt 137.0 lb

## 2011-09-21 DIAGNOSIS — K222 Esophageal obstruction: Secondary | ICD-10-CM

## 2011-09-21 DIAGNOSIS — R1013 Epigastric pain: Secondary | ICD-10-CM

## 2011-09-21 DIAGNOSIS — R110 Nausea: Secondary | ICD-10-CM

## 2011-09-21 DIAGNOSIS — Z1211 Encounter for screening for malignant neoplasm of colon: Secondary | ICD-10-CM

## 2011-09-21 DIAGNOSIS — K5730 Diverticulosis of large intestine without perforation or abscess without bleeding: Secondary | ICD-10-CM

## 2011-09-21 DIAGNOSIS — D126 Benign neoplasm of colon, unspecified: Secondary | ICD-10-CM

## 2011-09-21 DIAGNOSIS — K219 Gastro-esophageal reflux disease without esophagitis: Secondary | ICD-10-CM

## 2011-09-21 DIAGNOSIS — Z8719 Personal history of other diseases of the digestive system: Secondary | ICD-10-CM

## 2011-09-21 MED ORDER — SODIUM CHLORIDE 0.9 % IV SOLN: 500.0000 mL | INTRAVENOUS | Status: DC

## 2011-09-21 NOTE — Patient Instructions (Signed)
Please refer to your blue and neon green sheets for instructions regarding diet and activity for the rest of today.  You may resume your medications as you would normally take them.   Diverticulosis Diverticulosis is a common condition that develops when small pouches (diverticula) form in the wall of the colon. The risk of diverticulosis increases with age. It happens more often in people who eat a low-fiber diet. Most individuals with diverticulosis have no symptoms. Those individuals with symptoms usually experience abdominal pain, constipation, or loose stools (diarrhea). HOME CARE INSTRUCTIONS   Increase the amount of fiber in your diet as directed by your caregiver or dietician. This may reduce symptoms of diverticulosis.   Your caregiver may recommend taking a dietary fiber supplement.   Drink at least 6 to 8 glasses of water each day to prevent constipation.   Try not to strain when you have a bowel movement.   Your caregiver may recommend avoiding nuts and seeds to prevent complications, although this is still an uncertain benefit.   Only take over-the-counter or prescription medicines for pain, discomfort, or fever as directed by your caregiver.  FOODS WITH HIGH FIBER CONTENT INCLUDE:  Fruits. Apple, peach, pear, tangerine, raisins, prunes.   Vegetables. Brussels sprouts, asparagus, broccoli, cabbage, carrot, cauliflower, romaine lettuce, spinach, summer squash, tomato, winter squash, zucchini.   Starchy Vegetables. Baked beans, kidney beans, lima beans, split peas, lentils, potatoes (with skin).   Grains. Whole wheat bread, brown rice, bran flake cereal, plain oatmeal, white rice, shredded wheat, bran muffins.  SEEK IMMEDIATE MEDICAL CARE IF:   You develop increasing pain or severe bloating.   You have an oral temperature above 102 F (38.9 C), not controlled by medicine.   You develop vomiting or bowel movements that are bloody or black.  Document Released: 05/05/2004  Document Revised: 04/20/2011 Document Reviewed: 01/06/2010 ExitCare Patient Information 2012 [employer].  Colon Polyps A polyp is extra tissue that grows inside your body. Colon polyps grow in the large intestine. The large intestine, also called the colon, is part of your digestive system. It is a long, hollow tube at the end of your digestive tract where your body makes and stores stool. Most polyps are not dangerous. They are benign. This means they are not cancerous. But over time, some types of polyps can turn into cancer. Polyps that are smaller than a pea are usually not harmful. But larger polyps could someday become or may already be cancerous. To be safe, doctors remove all polyps and test them.  WHO GETS POLYPS? Anyone can get polyps, but certain people are more likely than others. You may have a greater chance of getting polyps if:  You are over 50.   You have had polyps before.   Someone in your family has had polyps.   Someone in your family has had cancer of the large intestine.   Find out if someone in your family has had polyps. You may also be more likely to get polyps if you:   Eat a lot of fatty foods.   Smoke.   Drink alcohol.   Do not exercise.   Eat too much.  SYMPTOMS  Most small polyps do not cause symptoms. People often do not know they have one until their caregiver finds it during a regular checkup or while testing them for something else. Some people do have symptoms like these:  Bleeding from the anus. You might notice blood on your underwear or on toilet paper after   you have had a bowel movement.   Constipation or diarrhea that lasts more than a week.   Blood in the stool. Blood can make stool look black or it can show up as red streaks in the stool.  If you have any of these symptoms, see your caregiver. HOW DOES THE DOCTOR TEST FOR POLYPS? The doctor can use four tests to check for polyps:  Digital rectal exam. The caregiver wears gloves  and checks your rectum (the last part of the large intestine) to see if it feels normal. This test would find polyps only in the rectum. Your caregiver may need to do one of the other tests listed below to find polyps higher up in the intestine.   Barium enema. The caregiver puts a liquid called barium into your rectum before taking x-rays of your large intestine. Barium makes your intestine look white in the pictures. Polyps are dark, so they are easy to see.   Sigmoidoscopy. With this test, the caregiver can see inside your large intestine. A thin flexible tube is placed into your rectum. The device is called a sigmoidoscope, which has a light and a tiny video camera in it. The caregiver uses the sigmoidoscope to look at the last third of your large intestine.   Colonoscopy. This test is like sigmoidoscopy, but the caregiver looks at all of the large intestine. It usually requires sedation. This is the most common method for finding and removing polyps.  TREATMENT   The caregiver will remove the polyp during sigmoidoscopy or colonoscopy. The polyp is then tested for cancer.   If you have had polyps, your caregiver may want you to get tested regularly in the future.  PREVENTION  There is not one sure way to prevent polyps. You might be able to lower your risk of getting them if you:  Eat more fruits and vegetables and less fatty food.   Do not smoke.   Avoid alcohol.   Exercise every day.   Lose weight if you are overweight.   Eating more calcium and folate can also lower your risk of getting polyps. Some foods that are rich in calcium are milk, cheese, and broccoli. Some foods that are rich in folate are chickpeas, kidney beans, and spinach.   Aspirin might help prevent polyps. Studies are under way.  Document Released: 05/04/2004 Document Revised: 04/20/2011 Document Reviewed: 10/10/2007 ExitCare Patient Information 2012 [employer]. 

## 2011-09-21 NOTE — Progress Notes (Signed)
Patient did not experience any of the following events: a burn prior to discharge; a fall within the facility; wrong site/side/patient/procedure/implant event; or a hospital transfer or hospital admission upon discharge from the facility. (G8907) Patient did not have preoperative order for IV antibiotic SSI prophylaxis. (G8918)  

## 2011-09-21 NOTE — Op Note (Signed)
[facility] [address] [place], [place]  [address]  ENDOSCOPY PROCEDURE REPORT  PATIENT:  [name], [name]  MR#:  [id number] BIRTHDATE:  11-11-1929, 81 yrs. old  GENDER:  female  ENDOSCOPIST:  Wilhemina Bonito. Eda Keys, MD Referred by:  .Dr. Debby Bud  PROCEDURE DATE:  09/21/2011 PROCEDURE:  EGD, diagnostic 43235 ASA CLASS:  Class II INDICATIONS:  epigastric pain  MEDICATIONS:   MAC sedation, administered by CRNA, propofol (Diprivan) 50 mg IV TOPICAL ANESTHETIC:  none  DESCRIPTION OF PROCEDURE:   After the risks benefits and alternatives of the procedure were thoroughly explained, informed consent was obtained.  The LB GIF-H180 [id number] endoscope was introduced through the mouth and advanced to the second portion of the duodenum, without limitations.  The instrument was slowly withdrawn as the mucosa was fully examined. <<PROCEDUREIMAGES>>  A benign ring-like stricture was found in the distal esophagus. Otherwise the examination of the esophagus, stomach and duodenum was normal.    Retroflexed views revealed a small hiatal hernia. The scope was then withdrawn from the patient and the procedure completed.  COMPLICATIONS:  None  ENDOSCOPIC IMPRESSION: 1) Benign Stricture in the distal esophagus 2) Otherwise normal examination 3) A hiatal hernia 4) GERD  RECOMMENDATIONS: 1) Anti-reflux regimen to be followed 2) Continue Nexium  ______________________________ Wilhemina Bonito. Eda Keys, MD  CC:  Jacques Navy, MD;  The Patient  n. eSIGNED:   Wilhemina Bonito. Eda Keys at 09/21/2011 03:06 PM  Wonda Cerise, [phone]

## 2011-09-21 NOTE — Op Note (Signed)
[facility] [address] [place], [place]  [address]  COLONOSCOPY PROCEDURE REPORT  PATIENT:  [name], [name]  MR#:  [id number] BIRTHDATE:  [birth year], 81 yrs. old  GENDER:  female ENDOSCOPIST:  Wilhemina Bonito. Eda Keys, MD REF. BY:  .Dr Debby Bud PROCEDURE DATE:  09/21/2011 PROCEDURE:  Colonoscopy with snare polypectomy x 1 ASA CLASS:  Class II INDICATIONS:  colorectal cancer screening, average risk ; November hospital stay for recurrent diverticulitis MEDICATIONS:   MAC sedation, administered by CRNA, propofol (Diprivan) 150 mg IV  DESCRIPTION OF PROCEDURE:   After the risks benefits and alternatives of the procedure were thoroughly explained, informed consent was obtained.  Digital rectal exam was performed and revealed no abnormalities.   The LB CF-[id number] endoscope was introduced through the anus and advanced to the cecum, which was identified by both the appendix and ileocecal valve, without limitations.  The quality of the prep was excellent, using MoviPrep.  The instrument was then slowly withdrawn as the colon was fully examined. <<PROCEDUREIMAGES>>  FINDINGS:  A 7mm sessile polyp was found in the cecum. Polyp was snared without cautery. Retrieval was successful.  Moderate diverticulosis was found in the sigmoid colon.  Otherwise normal colonoscopy without other polyps, masses, vascular ectasias, or inflammatory changes.   Retroflexed views in the rectum revealed internal hemorrhoids.    The time to cecum =  2:38  minutes. The scope was then withdrawn in 12:29  minutes from the cecum and the procedure completed.  COMPLICATIONS:  None  ENDOSCOPIC IMPRESSION: 1) Sessile polyp in the cecum - removed 2) Moderate diverticulosis in the sigmoid colon 3) Otherwise normal colonoscopy  RECOMMENDATIONS: 1)  High fiber diet 2) Upper endoscopy today  ______________________________ Wilhemina Bonito. Eda Keys, MD  CC:  Jacques Navy, MD;  The Patient  n. eSIGNED:    Wilhemina Bonito. Eda Keys at 09/21/2011 02:56 PM  Wonda Cerise, [phone]

## 2011-09-22 ENCOUNTER — Telehealth: Payer: Self-pay | Admitting: *Deleted

## 2011-09-22 NOTE — Telephone Encounter (Signed)
  Follow up Call-  Call back number 09/21/2011  Post procedure Call Back phone  # [phone]  Permission to leave phone message Yes     Patient questions:  Do you have a fever, pain , or abdominal swelling? no Pain Score  0 *  Have you tolerated food without any problems? yes  Have you been able to return to your normal activities? yes  Do you have any questions about your discharge instructions: Diet   no Medications  no Follow up visit  no  Do you have questions or concerns about your Care? no  Actions: * If pain score is 4 or above: No action needed, pain <4.

## 2011-09-27 ENCOUNTER — Encounter: Payer: Self-pay | Admitting: Internal Medicine

## 2011-10-28 ENCOUNTER — Other Ambulatory Visit: Payer: Self-pay | Admitting: Internal Medicine

## 2011-12-13 ENCOUNTER — Other Ambulatory Visit: Payer: Self-pay | Admitting: Internal Medicine

## 2012-01-04 ENCOUNTER — Other Ambulatory Visit: Payer: Self-pay | Admitting: Internal Medicine

## 2012-03-13 ENCOUNTER — Encounter: Payer: Self-pay | Admitting: Internal Medicine

## 2012-03-13 ENCOUNTER — Ambulatory Visit (INDEPENDENT_AMBULATORY_CARE_PROVIDER_SITE_OTHER): Payer: Medicare Other | Admitting: Internal Medicine

## 2012-03-13 VITALS — BP 114/68 | HR 95 | Temp 98.1°F | Resp 16 | Wt 145.0 lb

## 2012-03-13 DIAGNOSIS — K219 Gastro-esophageal reflux disease without esophagitis: Secondary | ICD-10-CM

## 2012-03-13 DIAGNOSIS — I10 Essential (primary) hypertension: Secondary | ICD-10-CM

## 2012-03-13 DIAGNOSIS — L218 Other seborrheic dermatitis: Secondary | ICD-10-CM

## 2012-03-13 DIAGNOSIS — L219 Seborrheic dermatitis, unspecified: Secondary | ICD-10-CM | POA: Insufficient documentation

## 2012-03-13 NOTE — Assessment & Plan Note (Signed)
BP Readings from Last 3 Encounters:  03/13/12 114/68  09/21/11 205/96  08/25/11 102/72   Good control on present regimen.

## 2012-03-13 NOTE — Assessment & Plan Note (Signed)
Continued symptoms that are generally controlled with PPI in AM and H2 blocker in PM along with Ginger. She had a normal EGD recently.  Plan - continue present regimen

## 2012-03-13 NOTE — Assessment & Plan Note (Signed)
By history and response to topical steroids her presentation is c/w eczema  Plan  capex shampoo ( fluocinolone topical) once a week. May use clobatazol 0.05% lotion prn for specific lesion  F/u with dermatologist in [place].

## 2012-03-13 NOTE — Progress Notes (Signed)
Subjective:    Patient ID: [name], female    DOB: August 07, 1930, 78 y.o.   MRN: [id number]  HPI [name] presents with several problems. 1. She has had an outbreak of lesions on her scalp. At Dr. Dorita Sciara office she was told she had a spider bite that spread. She was given hydroxazine, allegra and steroid ointment. She continued to have "lesions" that itched and she saw a dermatologist in [place], [place] and was started on clobetasol 0.05% lotion for a "bacterial infection." The lotion has helped but she still has outbreaks 2. Dr. Marina Goodell did Colonoscopy in Jan '13 - 1 tubular adenoma removed. She was told that at age 44 she is done with screening. Also had normal EGD. She still has dyspepsia despite nexium AM and Zantac PM. She does get relief with ginger 3. Persistent and increasing paresthesia feet - tingling no loss of sensation.   Past Medical History  Diagnosis Date  . BREAST CANCER, HX OF 03/16/2007  . PEPTIC ULCER DISEASE 03/16/2007  . IRRITABLE BOWEL SYNDROME, HX OF 03/16/2007  . DEGENERATIVE JOINT DISEASE, CERVICAL SPINE 03/16/2007  . HYPERLIPIDEMIA 05/18/2007  . ANXIETY DEPRESSION 08/25/2009  . Essential hypertension, benign 10/28/2008  . CORONARY ATHEROSCLEROSIS NATIVE CORONARY ARTERY 10/28/2008  . CAROTID ARTERY STENOSIS 10/28/2008  . Chronic rhinitis 09/17/2008  . GERD 10/10/2007  . Diverticulitis of colon (without mention of hemorrhage) 03/16/2007  . CAD (coronary artery disease) 2005    s/p DES to LCX  . Cystitis   . Palpitations    Past Surgical History  Procedure Date  . Reconstruction breast w/ tram flap     right  . Hernia repair 2002    ventral hernia  . Finger arthroplasty 2002    Right thumb  . Abdominal hysterectomy   . Cholecystectomy   . Mastectomy   . Oophorectomy   . Varicose vein surgery    Family History  Problem Relation Age of Onset  . Cancer Father     Lung Cancer  . Asthma Other   . Cancer Other     breast  . COPD    . Heart disease Mother   .  Heart disease Brother    History   Social History  . Marital Status: Widowed    Spouse Name: N/A    Number of Children: 5  . Years of Education: N/A   Occupational History  . Retired    Social History Main Topics  . Smoking status: Never Smoker   . Smokeless tobacco: Never Used  . Alcohol Use: No  . Drug Use: No  . Sexually Active: Not on file   Other Topics Concern  . Not on file   Social History Narrative   Has several children 3 dtrs, 2 sons, and several grandchildren in [place] and [address] She sees them often.Lives independently. Is very active    Current Outpatient Prescriptions on File Prior to Visit  Medication Sig Dispense Refill  . albuterol (PROVENTIL,VENTOLIN) 90 MCG/ACT inhaler Inhale 2 puffs into the lungs every 4 (four) hours as needed. Shortness of  breath      . aspirin 81 MG tablet Take 81 mg by mouth daily.       . calcium carbonate (OS-CAL) 600 MG TABS Take 600 mg by mouth daily.        . cetirizine (ZYRTEC) 10 MG tablet Take 10 mg by mouth daily as needed. allergies      . fish oil-omega-3 fatty acids 1000 MG capsule  3 capsules by mouth once a day       . FOSAMAX 70 MG tablet TAKE 1 TABLET WEEKLY.  4 each  5  . lisinopril (PRINIVIL,ZESTRIL) 20 MG tablet Take 1 tablet (20 mg total) by mouth daily.  30 tablet  6  . Multiple Vitamin (MULTIVITAMIN) capsule Take 1 capsule by mouth daily.        Marland Kitchen NEXIUM 40 MG capsule TAKE (1) CAPSULE DAILY.  90 each  1  . nitroGLYCERIN (NITROSTAT) 0.4 MG SL tablet Place 0.4 mg under the tongue every 5 (five) minutes as needed. Chest pains.      . ranitidine (ZANTAC) 150 MG tablet Take 150 mg by mouth at bedtime.       . rosuvastatin (CRESTOR) 5 MG tablet Take 5 mg by mouth daily.       . sertraline (ZOLOFT) 50 MG tablet Take once a day until seen in follow-up.  30 tablet  5      Review of Systems System review is negative for any constitutional, cardiac, pulmonary, GI or neuro symptoms or complaints other than as  described in the HPI.     Objective:   Physical Exam Filed Vitals:   03/13/12 1051  BP: 114/68  Pulse: 95  Temp: 98.1 F (36.7 C)  Resp: 16   Gen'l - WNWD nicely groomed white woman in no distress HEENT - Haena/AT, C&S clear Cor- RRR Abd - soft, BS+, not tender Derm - no visible lesions on scalp but there are places that feel rough to touch       Assessment & Plan:

## 2012-03-14 ENCOUNTER — Other Ambulatory Visit: Payer: Self-pay | Admitting: *Deleted

## 2012-03-14 MED ORDER — LISINOPRIL 20 MG PO TABS: 20.0000 mg | ORAL_TABLET | Freq: Every day | ORAL | Status: DC

## 2012-06-05 ENCOUNTER — Emergency Department (HOSPITAL_COMMUNITY): Payer: Medicare Other

## 2012-06-05 ENCOUNTER — Observation Stay (HOSPITAL_COMMUNITY)
Admission: EM | Admit: 2012-06-05 | Discharge: 2012-06-06 | Disposition: A | Discharge status: Home / Self Care | Payer: Medicare Other | Attending: Internal Medicine | Admitting: Internal Medicine

## 2012-06-05 ENCOUNTER — Observation Stay (HOSPITAL_COMMUNITY): Payer: Medicare Other

## 2012-06-05 ENCOUNTER — Telehealth: Payer: Self-pay | Admitting: Internal Medicine

## 2012-06-05 ENCOUNTER — Encounter (HOSPITAL_COMMUNITY): Payer: Self-pay | Admitting: *Deleted

## 2012-06-05 DIAGNOSIS — I10 Essential (primary) hypertension: Secondary | ICD-10-CM | POA: Insufficient documentation

## 2012-06-05 DIAGNOSIS — R55 Syncope and collapse: Principal | ICD-10-CM | POA: Diagnosis present

## 2012-06-05 DIAGNOSIS — F329 Major depressive disorder, single episode, unspecified: Secondary | ICD-10-CM | POA: Insufficient documentation

## 2012-06-05 DIAGNOSIS — K5730 Diverticulosis of large intestine without perforation or abscess without bleeding: Secondary | ICD-10-CM | POA: Insufficient documentation

## 2012-06-05 DIAGNOSIS — Z8711 Personal history of peptic ulcer disease: Secondary | ICD-10-CM | POA: Insufficient documentation

## 2012-06-05 DIAGNOSIS — R112 Nausea with vomiting, unspecified: Secondary | ICD-10-CM | POA: Insufficient documentation

## 2012-06-05 DIAGNOSIS — Z7982 Long term (current) use of aspirin: Secondary | ICD-10-CM | POA: Insufficient documentation

## 2012-06-05 DIAGNOSIS — R270 Ataxia, unspecified: Secondary | ICD-10-CM | POA: Diagnosis present

## 2012-06-05 DIAGNOSIS — E785 Hyperlipidemia, unspecified: Secondary | ICD-10-CM | POA: Insufficient documentation

## 2012-06-05 DIAGNOSIS — F411 Generalized anxiety disorder: Secondary | ICD-10-CM | POA: Insufficient documentation

## 2012-06-05 DIAGNOSIS — Z853 Personal history of malignant neoplasm of breast: Secondary | ICD-10-CM | POA: Insufficient documentation

## 2012-06-05 DIAGNOSIS — R42 Dizziness and giddiness: Secondary | ICD-10-CM | POA: Insufficient documentation

## 2012-06-05 DIAGNOSIS — M47812 Spondylosis without myelopathy or radiculopathy, cervical region: Secondary | ICD-10-CM | POA: Insufficient documentation

## 2012-06-05 DIAGNOSIS — K219 Gastro-esophageal reflux disease without esophagitis: Secondary | ICD-10-CM | POA: Insufficient documentation

## 2012-06-05 DIAGNOSIS — I6529 Occlusion and stenosis of unspecified carotid artery: Secondary | ICD-10-CM | POA: Insufficient documentation

## 2012-06-05 DIAGNOSIS — I2510 Atherosclerotic heart disease of native coronary artery without angina pectoris: Secondary | ICD-10-CM | POA: Insufficient documentation

## 2012-06-05 DIAGNOSIS — Z79899 Other long term (current) drug therapy: Secondary | ICD-10-CM | POA: Insufficient documentation

## 2012-06-05 DIAGNOSIS — F3289 Other specified depressive episodes: Secondary | ICD-10-CM | POA: Insufficient documentation

## 2012-06-05 HISTORY — DX: Ataxia, unspecified: R27.0

## 2012-06-05 LAB — CBC WITH DIFFERENTIAL/PLATELET
Basophils Absolute: 0 10*3/uL (ref 0.0–0.1)
Lymphs Abs: 1.9 10*3/uL (ref 0.7–4.0)
MCH: 30.6 pg (ref 26.0–34.0)
MCV: 90.2 fL (ref 78.0–100.0)
Monocytes Absolute: 0.6 10*3/uL (ref 0.1–1.0)
Platelets: ADEQUATE 10*3/uL (ref 150–400)
RDW: 13.6 % (ref 11.5–15.5)
WBC: 6.4 10*3/uL (ref 4.0–10.5)

## 2012-06-05 LAB — URINALYSIS, ROUTINE W REFLEX MICROSCOPIC
Ketones, ur: NEGATIVE mg/dL
Leukocytes, UA: NEGATIVE
Nitrite: NEGATIVE
Protein, ur: NEGATIVE mg/dL

## 2012-06-05 LAB — BASIC METABOLIC PANEL
Calcium: 9.8 mg/dL (ref 8.4–10.5)
Creatinine, Ser: 0.83 mg/dL (ref 0.50–1.10)
GFR calc Af Amer: 75 mL/min — ABNORMAL LOW (ref >90)
GFR calc non Af Amer: 65 mL/min — ABNORMAL LOW (ref >90)

## 2012-06-05 MED ORDER — ONDANSETRON HCL 4 MG/2ML IJ SOLN
4.0000 mg | Freq: Once | INTRAMUSCULAR | Status: AC
  Administered 2012-06-05: 4 mg via INTRAVENOUS

## 2012-06-05 MED ORDER — ONDANSETRON HCL 4 MG/2ML IJ SOLN
INTRAMUSCULAR | Status: AC
  Administered 2012-06-05: 4 mg via INTRAVENOUS
  Filled 2012-06-05: qty 2

## 2012-06-05 MED ORDER — MORPHINE SULFATE 2 MG/ML IJ SOLN
2.0000 mg | Freq: Once | INTRAMUSCULAR | Status: AC
  Administered 2012-06-05: 2 mg via INTRAVENOUS
  Filled 2012-06-05: qty 1

## 2012-06-05 MED ORDER — SODIUM CHLORIDE 0.9 % IV SOLN
Freq: Once | INTRAVENOUS | Status: AC
  Administered 2012-06-05: 18:00:00 via INTRAVENOUS

## 2012-06-05 MED ORDER — ONDANSETRON HCL 4 MG/2ML IJ SOLN
4.0000 mg | Freq: Once | INTRAMUSCULAR | Status: AC
  Administered 2012-06-05: 4 mg via INTRAVENOUS
  Filled 2012-06-05: qty 2

## 2012-06-05 NOTE — ED Notes (Signed)
REPORTS GIVEN TO 2000 UNIT NURSE , WILL TRANSPORT TO FLOOR AFTER CT SCAN OF HEAD AND LEFT HIP IS COMPLETED.

## 2012-06-05 NOTE — ED Provider Notes (Signed)
History     CSN: [id number]  Arrival date & time 06/05/12  1417   First MD Initiated Contact with Patient 06/05/12 1423      Chief Complaint  Patient presents with  . Loss of Consciousness    (Consider location/radiation/quality/duration/timing/severity/associated sxs/prior treatment) Patient is a 78 y.o. female presenting with syncope. The history is provided by the patient and a relative.  Loss of Consciousness This is a new problem. The current episode started today. Associated symptoms include nausea and vomiting. Pertinent negatives include no chest pain, fever or rash. Associated symptoms comments: She became lightheaded while in the shower earlier tonight and passed out for an unknown duration of time. She denies chest pain, shortness of breath, palpitations or headache. After the episode of passing out, She had vomiting and pain in her left flank area. No other post-syncopal symptoms. She states that she has had episodes of lightheadedness in the past without passing out.  .    Past Medical History  Diagnosis Date  . BREAST CANCER, HX OF 03/16/2007  . PEPTIC ULCER DISEASE 03/16/2007  . IRRITABLE BOWEL SYNDROME, HX OF 03/16/2007  . DEGENERATIVE JOINT DISEASE, CERVICAL SPINE 03/16/2007  . HYPERLIPIDEMIA 05/18/2007  . ANXIETY DEPRESSION 08/25/2009  . Essential hypertension, benign 10/28/2008  . CORONARY ATHEROSCLEROSIS NATIVE CORONARY ARTERY 10/28/2008  . CAROTID ARTERY STENOSIS 10/28/2008  . Chronic rhinitis 09/17/2008  . GERD 10/10/2007  . Diverticulitis of colon (without mention of hemorrhage) 03/16/2007  . CAD (coronary artery disease) 2005    s/p DES to LCX  . Cystitis   . Palpitations     Past Surgical History  Procedure Date  . Reconstruction breast w/ tram flap     right  . Hernia repair 2002    ventral hernia  . Finger arthroplasty 2002    Right thumb  . Abdominal hysterectomy   . Cholecystectomy   . Mastectomy   . Oophorectomy   . Varicose vein surgery      Family History  Problem Relation Age of Onset  . Cancer Father     Lung Cancer  . Asthma Other   . Cancer Other     breast  . COPD    . Heart disease Mother   . Heart disease Brother     History  Substance Use Topics  . Smoking status: Never Smoker   . Smokeless tobacco: Never Used  . Alcohol Use: No    OB History    Grav Para Term Preterm Abortions TAB SAB Ect Mult Living                  Review of Systems  Constitutional: Negative for fever.  Respiratory: Negative for shortness of breath.   Cardiovascular: Positive for syncope. Negative for chest pain and palpitations.  Gastrointestinal: Positive for nausea and vomiting.  Genitourinary: Negative for dysuria.  Skin: Negative for rash.  Neurological: Positive for dizziness and syncope.    Allergies  Flagyl; Amoxicillin-pot clavulanate; Codeine; Diazepam; Meperidine hcl; Sulfamethoxazole w-trimethoprim; and Valium  Home Medications   Current Outpatient Rx  Name Route Sig Dispense Refill  . ALBUTEROL 90 MCG/ACT IN AERS Inhalation Inhale 2 puffs into the lungs every 4 (four) hours as needed. Shortness of  breath    . ALENDRONATE SODIUM 70 MG PO TABS Oral Take 70 mg by mouth every 7 (seven) days. Take with a full glass of water on an empty stomach. Fridays    . ASPIRIN 81 MG PO TABS Oral Take 81 mg  by mouth daily.     . BECLOMETHASONE DIPROPIONATE 40 MCG/ACT IN AERS Inhalation Inhale 2 puffs into the lungs 2 (two) times daily as needed. For shortness of breath    . CETIRIZINE HCL 10 MG PO TABS Oral Take 10 mg by mouth daily as needed. allergies    . OMEGA-3 FATTY ACIDS 1000 MG PO CAPS  3 capsules by mouth once a day     . LISINOPRIL 20 MG PO TABS Oral Take 1 tablet (20 mg total) by mouth daily. 90 tablet 3  . MULTIVITAMINS PO CAPS Oral Take 1 capsule by mouth daily.      Marland Kitchen NEXIUM 40 MG PO CPDR  TAKE (1) CAPSULE DAILY. 90 each 1  . NITROGLYCERIN 0.4 MG SL SUBL Sublingual Place 0.4 mg under the tongue every 5 (five)  minutes as needed. Chest pains.    Marland Kitchen RANITIDINE HCL 150 MG PO TABS Oral Take 150 mg by mouth at bedtime.     Marland Kitchen ROSUVASTATIN CALCIUM 5 MG PO TABS Oral Take 5 mg by mouth daily.       BP 132/89  Pulse 82  Temp 98.5 F (36.9 C) (Oral)  Resp 14  SpO2 99%  Physical Exam  Constitutional: She is oriented to person, place, and time. She appears well-developed and well-nourished.  HENT:  Head: Normocephalic.  Neck: Normal range of motion. Neck supple.  Cardiovascular: Normal rate and regular rhythm.   Pulmonary/Chest: Effort normal and breath sounds normal.  Abdominal: Soft. Bowel sounds are normal. There is no tenderness. There is no rebound and no guarding.  Genitourinary:       Tenderness in left flank. No bruising.  Musculoskeletal: Normal range of motion.  Neurological: She is alert and oriented to person, place, and time.  Skin: Skin is warm and dry. No rash noted.  Psychiatric: She has a normal mood and affect.    ED Course  Procedures (including critical care time)  Labs Reviewed  BASIC METABOLIC PANEL - Abnormal; Notable for the following:    Glucose, Bld 100 (*)     GFR calc non Af Amer 65 (*)     GFR calc Af Amer 75 (*)     All other components within normal limits  CBC WITH DIFFERENTIAL  URINALYSIS, ROUTINE W REFLEX MICROSCOPIC  TROPONIN I   Dg Chest 1 View  06/05/2012  *RADIOLOGY REPORT*  Clinical Data: Loss of consciousness.  CHEST - 1 VIEW  Comparison: 09/19/2010.  Findings: The cardiac silhouette, mediastinal and hilar contours are normal and stable.  The lungs are clear.  Densely calcified mediastinal lymph nodes are noted along with coronary artery stents.  Surgical changes are noted involving the right chest wall. Vertebral augmentation changes noted at L2.  IMPRESSION: No acute cardiopulmonary findings.   Original Report Authenticated By: P. Loralie Champagne, M.D.    Dg Pelvis 1-2 Views  06/05/2012  *RADIOLOGY REPORT*  Clinical Data: Larey Seat.  Left hip pain.   PELVIS - 1-2 VIEW  Comparison: CT scan 07/25/2011.  Findings: There are advanced right hip joint degenerative changes with joint space narrowing, osteophytic spurring and subchondral cystic change.  The left hip demonstrates minimal degenerative changes.  No acute fracture.  The pubic symphysis and SI joints are intact.  No definite pelvic fractures.  Vascular calcifications are noted.  IMPRESSION: No acute bony findings. Advanced right hip joint degenerative changes.   Original Report Authenticated By: P. Loralie Champagne, M.D.    Dg Hip Complete Left  06/05/2012  *  RADIOLOGY REPORT*  Clinical Data: Left hip pain.  Fell.  LEFT HIP - COMPLETE 2+ VIEW  Comparison: None  Findings: The left hip is normally located.  No acute fracture.  No plain film evidence of avascular necrosis.  The pubic symphysis and left SI joint are intact.  Vascular calcifications are noted.  IMPRESSION: No acute bony findings.   Original Report Authenticated By: P. Loralie Champagne, M.D.      1. Vertigo   2. Syncope and collapse   3. Unspecified essential hypertension       MDM  Ambulates without dizziness but with onset of recurrent nausea. She lives alone, has persistent nausea with syncopal episode today. Discussed with [facility] for admission.        Rodena Medin, PA-C 06/05/12 2330

## 2012-06-05 NOTE — ED Notes (Signed)
Pt's daughter asked that her mothers IV be taken out as the pt has been discharged; daughter informed that is not true; PA informed; PA in the room with pt.

## 2012-06-05 NOTE — Telephone Encounter (Signed)
Caller: Terri/Child; Phone: [phone]; Reason for Call: Daughter calling to inform Dr Debby Bud that patient is in the ER at [facility] due to passing out earlier today.  Also states BP is currently 184/74.

## 2012-06-05 NOTE — ED Notes (Signed)
Patient has history of near syncope x 3-4 weeks- today had syncopal episode with nausea.  Larey Seat and is complaining of lower back pain.

## 2012-06-06 DIAGNOSIS — R42 Dizziness and giddiness: Secondary | ICD-10-CM

## 2012-06-06 DIAGNOSIS — R55 Syncope and collapse: Secondary | ICD-10-CM

## 2012-06-06 MED ORDER — DOCUSATE SODIUM 100 MG PO CAPS
100.0000 mg | ORAL_CAPSULE | Freq: Two times a day (BID) | ORAL | Status: DC
  Administered 2012-06-06: 100 mg via ORAL
  Filled 2012-06-06 (×2): qty 1

## 2012-06-06 MED ORDER — DEXTROSE-NACL 5-0.9 % IV SOLN
INTRAVENOUS | Status: DC
  Administered 2012-06-06 (×2): via INTRAVENOUS

## 2012-06-06 MED ORDER — ASPIRIN EC 81 MG PO TBEC
81.0000 mg | DELAYED_RELEASE_TABLET | Freq: Every day | ORAL | Status: DC
  Administered 2012-06-06: 81 mg via ORAL
  Filled 2012-06-06: qty 1

## 2012-06-06 MED ORDER — ADULT MULTIVITAMIN W/MINERALS CH
1.0000 | ORAL_TABLET | Freq: Every day | ORAL | Status: DC
  Administered 2012-06-06: 1 via ORAL
  Filled 2012-06-06: qty 1

## 2012-06-06 MED ORDER — ONDANSETRON HCL 4 MG/2ML IJ SOLN: 4.0000 mg | Freq: Four times a day (QID) | INTRAMUSCULAR | Status: DC | PRN

## 2012-06-06 MED ORDER — NITROGLYCERIN 0.4 MG SL SUBL: 0.4000 mg | SUBLINGUAL_TABLET | SUBLINGUAL | Status: DC | PRN

## 2012-06-06 MED ORDER — MULTIVITAMINS PO CAPS: 1.0000 | ORAL_CAPSULE | Freq: Every day | ORAL | Status: DC

## 2012-06-06 MED ORDER — PANTOPRAZOLE SODIUM 40 MG PO TBEC
80.0000 mg | DELAYED_RELEASE_TABLET | Freq: Every day | ORAL | Status: DC
  Filled 2012-06-06: qty 2

## 2012-06-06 MED ORDER — ASPIRIN 81 MG PO TABS: 81.0000 mg | ORAL_TABLET | Freq: Every day | ORAL | Status: DC

## 2012-06-06 MED ORDER — SODIUM CHLORIDE 0.9 % IJ SOLN: 3.0000 mL | Freq: Two times a day (BID) | INTRAMUSCULAR | Status: DC

## 2012-06-06 MED ORDER — ATORVASTATIN CALCIUM 10 MG PO TABS
10.0000 mg | ORAL_TABLET | Freq: Every day | ORAL | Status: DC
  Administered 2012-06-06: 10 mg via ORAL
  Filled 2012-06-06: qty 1

## 2012-06-06 MED ORDER — ALENDRONATE SODIUM 70 MG PO TABS: 70.0000 mg | ORAL_TABLET | ORAL | Status: DC

## 2012-06-06 MED ORDER — LISINOPRIL 20 MG PO TABS
20.0000 mg | ORAL_TABLET | Freq: Every day | ORAL | Status: DC
  Administered 2012-06-06: 20 mg via ORAL
  Filled 2012-06-06: qty 1

## 2012-06-06 MED ORDER — ALBUTEROL SULFATE HFA 108 (90 BASE) MCG/ACT IN AERS
2.0000 | INHALATION_SPRAY | RESPIRATORY_TRACT | Status: DC | PRN
  Filled 2012-06-06: qty 6.7

## 2012-06-06 MED ORDER — FLUTICASONE PROPIONATE HFA 44 MCG/ACT IN AERO
1.0000 | INHALATION_SPRAY | Freq: Two times a day (BID) | RESPIRATORY_TRACT | Status: DC
  Administered 2012-06-06: 1 via RESPIRATORY_TRACT
  Filled 2012-06-06: qty 10.6

## 2012-06-06 MED ORDER — PANTOPRAZOLE SODIUM 40 MG PO TBEC: 80.0000 mg | DELAYED_RELEASE_TABLET | Freq: Every day | ORAL | Status: DC

## 2012-06-06 MED ORDER — OMEGA-3 FATTY ACIDS 1000 MG PO CAPS: 1.0000 g | ORAL_CAPSULE | Freq: Every day | ORAL | Status: DC

## 2012-06-06 MED ORDER — FAMOTIDINE 20 MG PO TABS
20.0000 mg | ORAL_TABLET | Freq: Every day | ORAL | Status: DC
  Administered 2012-06-06: 20 mg via ORAL
  Filled 2012-06-06: qty 1

## 2012-06-06 MED ORDER — ALBUTEROL 90 MCG/ACT IN AERS: 2.0000 | INHALATION_SPRAY | RESPIRATORY_TRACT | Status: DC | PRN

## 2012-06-06 MED ORDER — OMEGA-3-ACID ETHYL ESTERS 1 G PO CAPS
1.0000 g | ORAL_CAPSULE | Freq: Every day | ORAL | Status: DC
  Administered 2012-06-06: 1 g via ORAL
  Filled 2012-06-06: qty 1

## 2012-06-06 MED ORDER — ONDANSETRON HCL 4 MG PO TABS: 4.0000 mg | ORAL_TABLET | Freq: Four times a day (QID) | ORAL | Status: DC | PRN

## 2012-06-06 MED ORDER — LORATADINE 10 MG PO TABS
10.0000 mg | ORAL_TABLET | Freq: Every day | ORAL | Status: DC
  Administered 2012-06-06: 10 mg via ORAL
  Filled 2012-06-06: qty 1

## 2012-06-06 NOTE — Discharge Summary (Signed)
[name], [name]            ACCOUNT NO.:  [id number]  MEDICAL RECORD NO.:  [id number]  LOCATION:  2030                         FACILITY:  [facility]  PHYSICIAN:  Rosalyn Gess. Maryclare Nydam, MD  DATE OF BIRTH:  July 27, 1930  DATE OF ADMISSION:  06/05/2012 DATE OF DISCHARGE:  06/06/2012                              DISCHARGE SUMMARY   ADMITTING DIAGNOSIS:  Syncope.  DISCHARGE DIAGNOSIS:  Unexplained near-syncope.  CONSULTANTS:  None.  PROCEDURES: 1. Chest x-ray on the day of admission which showed no acute     cardiopulmonary findings. 2. Diagnostic hip x-ray last complete which showed no acute bony     abnormalities or findings.  No avascular necrosis. 3. Diagnostic x-ray of pelvis two views which showed no acute bony     findings.  Advanced right hip joint degenerative changes are noted. 4. CT scan of the head which showed no acute intracranial     abnormalities.  Chronic atrophy and small vessel ischemic changes     noted.  Degenerative changes in the temporomandibular joints are     stable. 5. CT of the left hip without contrast which revealed degenerative     changes in the left hip.  No acute fracture or subluxation is     identified.  HISTORY OF PRESENT ILLNESS:  [name] is an 78 year old Caucasian woman with well-controlled hypertension, history of vertigo in the past, hyperlipidemia and known CAD.  For the past several weeks, she has found herself having occasional episodes of feeling lightheaded usually associated with showering.  She did have an episode on the day of admission where she was taking a shower, felt herself lightheaded and then had a full syncopal episode.  She denied any prodroma, no palpitations, no headache, no chest pain, no shortness of breath.  She had no slurred speech.  She had no focal weakness.  She had no other neurologic symptoms.  She did awake spontaneously.  She has had no incontinence.  The patient did report pain in her left hip after  her fall.  EMS was called.  The patient was transported to [facility] Emergency Department where she had a normal EKG.  Left hip x-ray without fracture.  Initial cardiac enzymes were negative.  She has attempted to ambulate, but this resulted in her having nausea.  She was subsequently admitted for 24-hour observation for syncope and vertigo and left hip pain. Please see the H and P and other epic records for past medical history, family history, social history, and examination on admission which was notable for normal vital signs with a blood pressure of 147/94. Cardiovascular exam was unremarkable with a regular rate and rhythm without murmurs, rubs, or gallops.  Neurologic exam was rated as a nonfocal with cranial nerves II through XII grossly intact.  Speech was fluent, normal phonation, normal facial symmetry.  Tongue was midline. Normal DTR.  Cerebellar exam was unremarkable.  Babinski was negative. Strength was normal and equal bilaterally.  There is no sensory loss.  HOSPITAL COURSE:  The patient was admitted to a telemetry floor.  She was noted to have frequent PVCs and occasional short runs of bigeminy, but no other significant cardiac arrhythmias.  Her vital  signs did remain stable.  Cardiac enzymes were negative at admission.  No repeats were done.  Chemistries were unremarkable.  The patient did have orthostatic vital signs ordered for 8:00 a.m. this morning and 2 o'clock this afternoon.  Unfortunately only morning orthostatics were done, but they were normal. The patient had no events during the day.  She was able to ambulate in her room, moving from bed to chair without difficulty.  She had no near syncopal episode and remained alert, awake throughout the day. The patient having normal laboratory, was not having any orthostatics changes with no significant arrhythmias on telemetry with radiologic evaluations being normal at this point she is able to be discharged  to home.  The patient will be coming by the office on Friday 18th for a flu shot and also can have a rapid evaluation at that time.  She is instructed if she has recurrent episodes of near-syncope or syncope, she needs to notify the office and the next step would be electrophysiology evaluation for either cardiogenic or vasovagal syncope. The patient will continue on all of her home medications without change. She does live with friends, has adequate supervision and her daughters do live in town.  DISCHARGE PHYSICAL EXAMINATION:  VITAL SIGNS:  Temperature was 97.8, blood pressure 133/91, pulse 98, respirations 18, O2 sats 96% on room air. GENERAL APPEARANCE:  A well-nourished, well-developed woman, looking younger than her chronologic age. HEENT:  Conjunctivae, sclerae was clear.  Pupils equal, round, and reactive. NECK:  Supple. CHEST:  Without deformity. PULMONARY:  The patient has normal respirations.  No rales, wheezes or rhonchi. CARDIOVASCULAR:  The patient has a quiet precordium.  Her heart rate was regular. ABDOMEN:  Soft. NEUROLOGIC:  The patient is awake, alert.  She is oriented to person, place, time and context.  Cognitively intact.  Cranial nerves were grossly normal.  No cerebellar testing.  The patient did not stood or ambulated that notes says above.  DISPOSITION:  The patient is to be discharged to home.  She will return for a flu shot on Friday 18th.  She will be seen for routine followup. Hopefully, briefly on Friday, she does wish to return to the beach and that may delay any further evaluation, although she is clearly instructed that if she has any recurrent symptoms that she needs to notify us immediately, so we can arrange for further evaluation.  The patient's condition at the time of discharge dictation is medically stable and improved.     Rosalyn Gess Haniel Fix, MD     MEN/MEDQ  D:  06/06/2012  T:  06/06/2012  Job:  [id number]

## 2012-06-06 NOTE — ED Notes (Signed)
TRANSPORTED IN STABLE CONDITION  , NO PAIN , RESPIRATIONS UNLABORED , IV SITE INTACT.

## 2012-06-06 NOTE — H&P (Signed)
[facility] History and Physical  [name] ZOX:[id number] DOB: 1930/07/09    PCP:   Illene Regulus, MD   Chief Complaint: vertigo, syncope, and nausea.  HPI: [name] is an 78 y.o. female with hx of well controlled HTN, hx of vertigo, anxiety, Hyperlipidemia, CAD, found herself lightheaded and subsequently had a syncopal episode.  She denied palpitation, HA, chest pain or SOB.  She has no slurred speech or any focal neurological symptoms.  She has pain in her left hip after the fall.  Evaluation in the ER included an unremarkable EKG, and left hip Xray showed no fracture.  Her troponin was negative as well. She had a negative CXR as well.  Attempted to ambulate her resulted in her feeling nauseated.  Hospitalist was asked to admit her for syncope, vertigo, and left hip pain.  Rewiew of Systems:  Constitutional: Negative for malaise, fever and chills. No significant weight loss or weight gain Eyes: Negative for eye pain, redness and discharge, diplopia, visual changes, or flashes of light. ENMT: Negative for ear pain, hoarseness, nasal congestion, sinus pressure and sore throat. No headaches; tinnitus, drooling, or problem swallowing. Cardiovascular: Negative for chest pain, palpitations, diaphoresis, dyspnea and peripheral edema. ; No orthopnea, PND Respiratory: Negative for cough, hemoptysis, wheezing and stridor. No pleuritic chestpain. Gastrointestinal: Negative for nausea, vomiting, diarrhea, constipation, abdominal pain, melena, blood in stool, hematemesis, jaundice and rectal bleeding.    Genitourinary: Negative for frequency, dysuria, incontinence,flank pain and hematuria; Musculoskeletal: Negative for back pain and neck pain. Negative for swelling and trauma.;  Skin: . Negative for pruritus, rash, abrasions, bruising and skin lesion.; ulcerations Neuro: Negative for headache,and neck stiffness. Negative for weakness, altered level of consciousness , altered  mental status, extremity weakness, burning feet, involuntary movement, seizure and syncope.  Psych: negative for anxiety, depression, insomnia, tearfulness, panic attacks, hallucinations, paranoia, suicidal or homicidal ideation    Past Medical History  Diagnosis Date  . BREAST CANCER, HX OF 03/16/2007  . PEPTIC ULCER DISEASE 03/16/2007  . IRRITABLE BOWEL SYNDROME, HX OF 03/16/2007  . DEGENERATIVE JOINT DISEASE, CERVICAL SPINE 03/16/2007  . HYPERLIPIDEMIA 05/18/2007  . ANXIETY DEPRESSION 08/25/2009  . Essential hypertension, benign 10/28/2008  . CORONARY ATHEROSCLEROSIS NATIVE CORONARY ARTERY 10/28/2008  . CAROTID ARTERY STENOSIS 10/28/2008  . Chronic rhinitis 09/17/2008  . GERD 10/10/2007  . Diverticulitis of colon (without mention of hemorrhage) 03/16/2007  . CAD (coronary artery disease) 2005    s/p DES to LCX  . Cystitis   . Palpitations     Past Surgical History  Procedure Date  . Reconstruction breast w/ tram flap     right  . Hernia repair 2002    ventral hernia  . Finger arthroplasty 2002    Right thumb  . Abdominal hysterectomy   . Cholecystectomy   . Mastectomy   . Oophorectomy   . Varicose vein surgery     Medications:  HOME MEDS: Prior to Admission medications   Medication Sig Start Date End Date Taking? Authorizing Provider  albuterol (PROVENTIL,VENTOLIN) 90 MCG/ACT inhaler Inhale 2 puffs into the lungs every 4 (four) hours as needed. Shortness of  breath   Yes Historical Provider, MD  alendronate (FOSAMAX) 70 MG tablet Take 70 mg by mouth every 7 (seven) days. Take with a full glass of water on an empty stomach. Fridays   Yes Historical Provider, MD  aspirin 81 MG tablet Take 81 mg by mouth daily.    Yes Historical Provider, MD  beclomethasone (QVAR)  40 MCG/ACT inhaler Inhale 2 puffs into the lungs 2 (two) times daily as needed. For shortness of breath   Yes Historical Provider, MD  cetirizine (ZYRTEC) 10 MG tablet Take 10 mg by mouth daily as needed. allergies   Yes  Historical Provider, MD  fish oil-omega-3 fatty acids 1000 MG capsule 3 capsules by mouth once a day    Yes Historical Provider, MD  lisinopril (PRINIVIL,ZESTRIL) 20 MG tablet Take 1 tablet (20 mg total) by mouth daily. 03/14/12  Yes Lewayne Bunting, MD  Multiple Vitamin (MULTIVITAMIN) capsule Take 1 capsule by mouth daily.     Yes Historical Provider, MD  NEXIUM 40 MG capsule TAKE (1) CAPSULE DAILY. 12/13/11  Yes Jacques Navy, MD  nitroGLYCERIN (NITROSTAT) 0.4 MG SL tablet Place 0.4 mg under the tongue every 5 (five) minutes as needed. Chest pains.   Yes Historical Provider, MD  ranitidine (ZANTAC) 150 MG tablet Take 150 mg by mouth at bedtime.    Yes Historical Provider, MD  rosuvastatin (CRESTOR) 5 MG tablet Take 5 mg by mouth daily.    Yes Historical Provider, MD     Allergies:  Allergies  Allergen Reactions  . Flagyl (Metronidazole Hcl) Diarrhea  . Amoxicillin-Pot Clavulanate Diarrhea  . Codeine Nausea And Vomiting    Over-sedating  . Diazepam     Hallucination   . Meperidine Hcl Nausea Only  . Sulfamethoxazole W-Trimethoprim Diarrhea  . Valium     Elevated blood pressure    Social History:   reports that she has never smoked. She has never used smokeless tobacco. She reports that she does not drink alcohol or use illicit drugs.  Family History: Family History  Problem Relation Age of Onset  . Cancer Father     Lung Cancer  . Asthma Other   . Cancer Other     breast  . COPD    . Heart disease Mother   . Heart disease Brother      Physical Exam: Filed Vitals:   06/05/12 2307 06/05/12 2310 06/06/12 0058 06/06/12 0457  BP:  132/89 145/81 147/94  Pulse:  82 94 99  Temp: 98.5 F (36.9 C)  98.3 F (36.8 C) 98.5 F (36.9 C)  TempSrc:   Oral Oral  Resp:  14 18 18   Height:   5\' 3"  (1.6 m)   Weight:   63.9 kg (140 lb 14 oz)   SpO2:  99% 99% 94%   Blood pressure 147/94, pulse 99, temperature 98.5 F (36.9 C), temperature source Oral, resp. rate 18, height 5\' 3"   (1.6 m), weight 63.9 kg (140 lb 14 oz), SpO2 94.00%.  GEN:  Pleasant  patient lying in the stretcher in no acute distress; cooperative with exam. PSYCH:  alert and oriented x4; does not appear anxious or depressed; affect is appropriate. HEENT: Mucous membranes pink and anicteric; PERRLA; EOM intact; no cervical lymphadenopathy nor thyromegaly or carotid bruit; no JVD; There were no stridor. Neck is very supple. Breasts:: Not examined CHEST WALL: No tenderness CHEST: Normal respiration, clear to auscultation bilaterally.  HEART: Regular rate and rhythm.  There are no murmur, rub, or gallops.   BACK: No kyphosis or scoliosis; no CVA tenderness ABDOMEN: soft and non-tender; no masses, no organomegaly, normal abdominal bowel sounds; no pannus; no intertriginous candida. There is no rebound and no distention. Rectal Exam: Not done EXTREMITIES: No bone or joint deformity; age-appropriate arthropathy of the hands and knees; no edema; no ulcerations.  There is no calf tenderness.  Pain on hip with external rotation. Genitalia: not examined PULSES: 2+ and symmetric SKIN: Normal hydration no rash or ulceration CNS: Cranial nerves 2-12 grossly intact no focal lateralizing neurologic deficit.  Speech is fluent; uvula elevated with phonation, facial symmetry and tongue midline. DTR are normal bilaterally, cerebella exam is intact, barbinski is negative and strengths are equaled bilaterally.  No sensory loss.   Labs on Admission:  Basic Metabolic Panel:  Lab 06/05/12 4098  NA 139  K 4.7  CL 105  CO2 22  GLUCOSE 100*  BUN 15  CREATININE 0.83  CALCIUM 9.8  MG --  PHOS --   Liver Function Tests: No results found for this basename: AST:5,ALT:5,ALKPHOS:5,BILITOT:5,PROT:5,ALBUMIN:5 in the last 168 hours No results found for this basename: LIPASE:5,AMYLASE:5 in the last 168 hours No results found for this basename: AMMONIA:5 in the last 168 hours CBC:  Lab 06/05/12 1758  WBC 6.4  NEUTROABS 3.8    HGB 13.5  HCT 39.8  MCV 90.2  PLT PLATELET CLUMPS NOTED ON SMEAR, COUNT APPEARS ADEQUATE   Cardiac Enzymes:  Lab 06/05/12 1758  CKTOTAL --  CKMB --  CKMBINDEX --  TROPONINI <0.30    CBG: No results found for this basename: GLUCAP:5 in the last 168 hours   Radiological Exams on Admission: Dg Chest 1 View  06/05/2012  *RADIOLOGY REPORT*  Clinical Data: Loss of consciousness.  CHEST - 1 VIEW  Comparison: 09/19/2010.  Findings: The cardiac silhouette, mediastinal and hilar contours are normal and stable.  The lungs are clear.  Densely calcified mediastinal lymph nodes are noted along with coronary artery stents.  Surgical changes are noted involving the right chest wall. Vertebral augmentation changes noted at L2.  IMPRESSION: No acute cardiopulmonary findings.   Original Report Authenticated By: P. Loralie Champagne, M.D.    Dg Pelvis 1-2 Views  06/05/2012  *RADIOLOGY REPORT*  Clinical Data: Larey Seat.  Left hip pain.  PELVIS - 1-2 VIEW  Comparison: CT scan 07/25/2011.  Findings: There are advanced right hip joint degenerative changes with joint space narrowing, osteophytic spurring and subchondral cystic change.  The left hip demonstrates minimal degenerative changes.  No acute fracture.  The pubic symphysis and SI joints are intact.  No definite pelvic fractures.  Vascular calcifications are noted.  IMPRESSION: No acute bony findings. Advanced right hip joint degenerative changes.   Original Report Authenticated By: P. Loralie Champagne, M.D.    Dg Hip Complete Left  06/05/2012  *RADIOLOGY REPORT*  Clinical Data: Left hip pain.  Fell.  LEFT HIP - COMPLETE 2+ VIEW  Comparison: None  Findings: The left hip is normally located.  No acute fracture.  No plain film evidence of avascular necrosis.  The pubic symphysis and left SI joint are intact.  Vascular calcifications are noted.  IMPRESSION: No acute bony findings.   Original Report Authenticated By: P. Loralie Champagne, M.D.    Ct Head Wo  Contrast  06/06/2012  *RADIOLOGY REPORT*  Clinical Data: Pain after fall.  CT HEAD WITHOUT CONTRAST  Technique:  Contiguous axial images were obtained from the base of the skull through the vertex without contrast.  Comparison: 04/27/2004  Findings: Mild diffuse cerebral atrophy.  Low attenuation changes in the deep white matter consistent with small vessel ischemia. Basal ganglia calcifications.  No mass effect or midline shift.  No abnormal extra-axial fluid collections.  Gray-white matter junctions are distinct.  Basal cisterns are not effaced.  No evidence of acute intracranial hemorrhage.  No depressed skull fractures.  Visualized  paranasal sinuses and mastoid air cells are not opacified.  There is fragmentation of the right mandibular head similar previous study and likely due to old trauma or degenerative change.  Mild deformity of the left temporomandibular joint consistent with degenerative change.  No significant change since previous study.  IMPRESSION: No acute intracranial abnormalities.  Chronic atrophy and small vessel ischemic changes.  Degenerative changes in the temporomandibular joints are stable.   Original Report Authenticated By: Marlon Pel, M.D.    Ct Hip Left Wo Contrast  06/06/2012  *RADIOLOGY REPORT*  Clinical Data: Left hip pain after fall.  CT OF THE LEFT HIP WITHOUT CONTRAST  Technique:  Multidetector CT imaging was performed according to the standard protocol. Multiplanar CT image reconstructions were also generated.  Comparison: CT abdomen and pelvis 07/25/2011  Findings: Diffuse bone demineralization.  Mild degenerative changes in the left hip with hypertrophic changes off of the acetabulum. No displaced fractures are identified.  The bone cortex and trabecular architecture appears intact.  No focal bone lesion or bone destruction.  No soft tissue hematomas.  Small left inguinal hernia containing fat.  Diverticula in the sigmoid colon without diverticulitis.  IMPRESSION:  Degenerative changes in the left hip.  No acute fracture or subluxation is identified.   Original Report Authenticated By: Marlon Pel, M.D.       Assessment/Plan Present on Admission:  .Vertigo .Syncope and collapse .Essential hypertension, benign  PLAN:  I think her symptoms are related to vertigo including vertigo-induced nausea.  She did have a syncope.  Will admit her to Dr Norins'serivce under OBS, cycle troponins and monitor rhythm.  I would like to get a CT of the hip to be sure she does not have a fx.  Also will get a head CT with contrast (she is too claustrophobic to get MRI) to r/out acoustic neuroma given her symptoms are more suggestive of central then peripheral.  She may benefit getting vestibular rehab.  She is over all stable and I have continued her meds, except lowering her lisinopril a little.  She is a full code and will be seen by Dr Debby Bud as per prior arrangement.  Thank you for allowing me to participate in the care of your lovely patient.  Other plans as per orders.  Code Status: FULL Unk Lightning, MD. [facility] Pager [phone] 7pm to 7am.  06/06/2012, 5:09 AM

## 2012-06-06 NOTE — Progress Notes (Signed)
Subjective: [name] is admitted for observation after episode of syncope with fall in the shower and subsequent hip pain.  She has had negative CT head, x-ray hip/pelvis-negative, CT hip negative. ECG was normal and initial cardiac enzymes were negative.   Objective: Lab: Lab Results  Component Value Date   WBC 6.4 06/05/2012   HGB 13.5 06/05/2012   HCT 39.8 06/05/2012   MCV 90.2 06/05/2012   PLT PLATELET CLUMPS NOTED ON SMEAR, COUNT APPEARS ADEQUATE 06/05/2012   BMET    Component Value Date/Time   NA 139 06/05/2012 1758   K 4.7 06/05/2012 1758   CL 105 06/05/2012 1758   CO2 22 06/05/2012 1758   GLUCOSE 100* 06/05/2012 1758   BUN 15 06/05/2012 1758   CREATININE 0.83 06/05/2012 1758   CALCIUM 9.8 06/05/2012 1758   GFRNONAA 65* 06/05/2012 1758   GFRAA 75* 06/05/2012 1758     Imaging:  Scheduled Meds:   . sodium chloride   Intravenous Once  . aspirin EC  81 mg Oral Daily  . atorvastatin  10 mg Oral q1800  . docusate sodium  100 mg Oral BID  . famotidine  20 mg Oral Daily  . fluticasone  1 puff Inhalation BID  . lisinopril  20 mg Oral Daily  . loratadine  10 mg Oral Daily  .  morphine injection  2 mg Intravenous Once  . multivitamin with minerals  1 tablet Oral Daily  . omega-3 acid ethyl esters  1 g Oral Daily  . ondansetron (ZOFRAN) IV  4 mg Intravenous Once  . ondansetron (ZOFRAN) IV  4 mg Intravenous Once  . pantoprazole  80 mg Oral Q1200  . sodium chloride  3 mL Intravenous Q12H  . DISCONTD: alendronate  70 mg Oral Q7 days  . DISCONTD: aspirin  81 mg Oral Daily  . DISCONTD: fish oil-omega-3 fatty acids  1 g Oral Daily  . DISCONTD: multivitamin  1 capsule Oral Daily   Continuous Infusions:   . dextrose 5 % and 0.9% NaCl 75 mL/hr at 06/06/12 0218   PRN Meds:.albuterol, nitroGLYCERIN, ondansetron (ZOFRAN) IV, ondansetron, DISCONTD: albuterol   Physical Exam: Filed Vitals:   06/06/12 0457  BP: 147/94  Pulse: 99  Temp: 98.5 F (36.9 C)  Resp: 18    Gen'l- WNWD white woman in no distress. Reports she was able to ambulate to BR- no trouble with weight bearing HEENT- [place]/AT, C&S clear, PERRLA Neck- supple Cor - Irregular with pvc'S  Pulm - normal respirations Abd- soft Neuro - A&O x 3    Assessment/Plan: Vertigo/syncope - patient's symptoms seem to occur with hot showers. Eval todate is negative. Suspect she has orthostatic hypotension exacerbated by vaso-dilation associated with heat.  Plan Up as tolerated  Orthostatic vitals x 2 during the day   [employer] IM (o) [phone]; (c) [phone] Call-grp - P[facility] IM Tele: [phone]  06/06/2012, 7:11 AM

## 2012-06-06 NOTE — Care Management Note (Unsigned)
    Page 1 of 1   06/06/2012     3:07:53 PM   CARE MANAGEMENT NOTE 06/06/2012  Patient:  [name], [name]   Account Number:  [id number]  Date Initiated:  06/06/2012  Documentation initiated by:  Garris Melhorn  Subjective/Objective Assessment:   PT ADM ON 06/05/12 AFTER SYNCOPAL EPISODE.  PTA, PT INDEPENDENT, LIVES AT [facility], INDEPENDENT LIVING.     Action/Plan:   WILL FOLLOW FOR HOME NEEDS AS PT PROGRESSES.   Anticipated DC Date:  06/07/2012   Anticipated DC Plan:  HOME/SELF CARE      DC Planning Services  CM consult      Choice offered to / List presented to:             Status of service:  In process, will continue to follow Medicare Important Message given?   (If response is "NO", the following Medicare IM given date fields will be blank) Date Medicare IM given:   Date Additional Medicare IM given:    Discharge Disposition:    Per UR Regulation:  Reviewed for med. necessity/level of care/duration of stay  If discussed at Long Length of Stay Meetings, dates discussed:    Comments:

## 2012-06-06 NOTE — ED Provider Notes (Signed)
Medical screening examination/treatment/procedure(s) were performed by non-physician practitioner and as supervising physician I was immediately available for consultation/collaboration.  Adrien Shankar, MD 06/06/12 0116 

## 2012-06-08 ENCOUNTER — Ambulatory Visit (INDEPENDENT_AMBULATORY_CARE_PROVIDER_SITE_OTHER): Payer: Medicare Other

## 2012-06-08 DIAGNOSIS — Z23 Encounter for immunization: Secondary | ICD-10-CM

## 2012-06-23 IMAGING — CT CT ABD-PELV W/O CM
2 of 4 series · 16 of 46 positions shown, 18 images · non-contrast
Comparison: CT of the abdomen and pelvis performed 09/27/2010

CLINICAL DATA: Lower pelvic and abdominal pain; vomiting and
diarrhea for 10 days.

CT ABDOMEN AND PELVIS WITHOUT CONTRAST
TECHNIQUE: Multidetector CT imaging of the abdomen and pelvis was
performed following the standard protocol without intravenous
contrast.

[Series 2: rtn a/p w/o · axial · non-contrast · 0.74mm/px · z∈[-424,-68]mm · 13 of 79 slices shown, 15 images]
[im 4/79  soft-tissue]
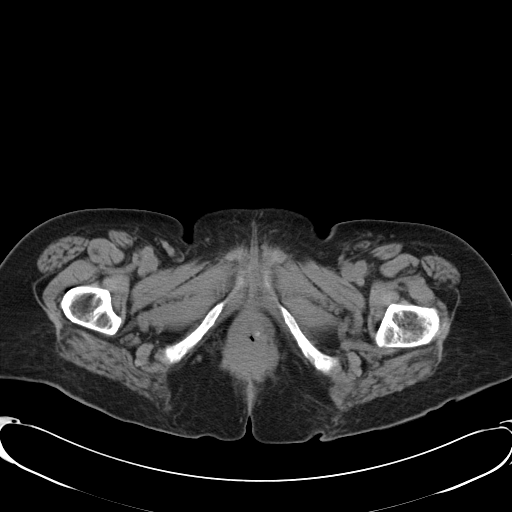
[im 4/79  bone]
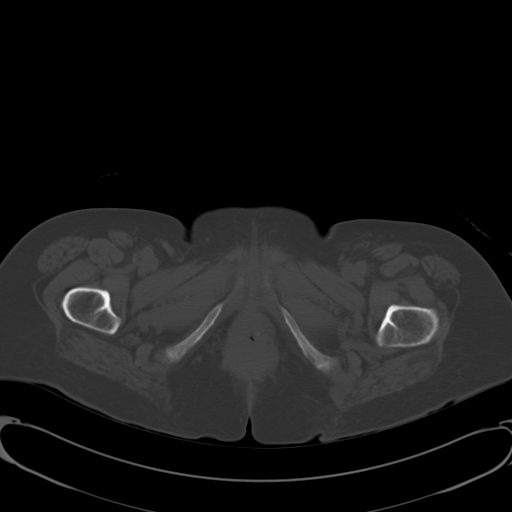
[im 11/79  soft-tissue]
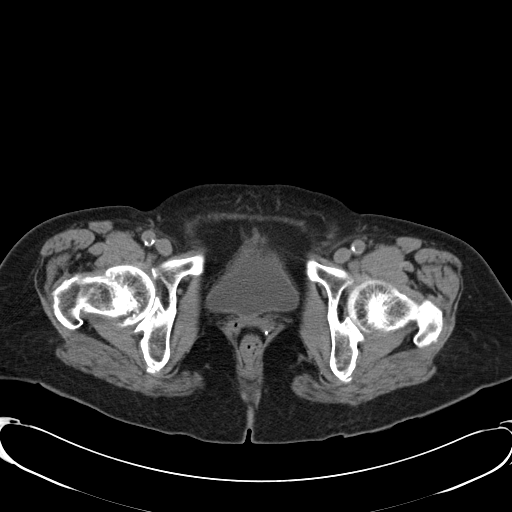
[im 17/79  soft-tissue]
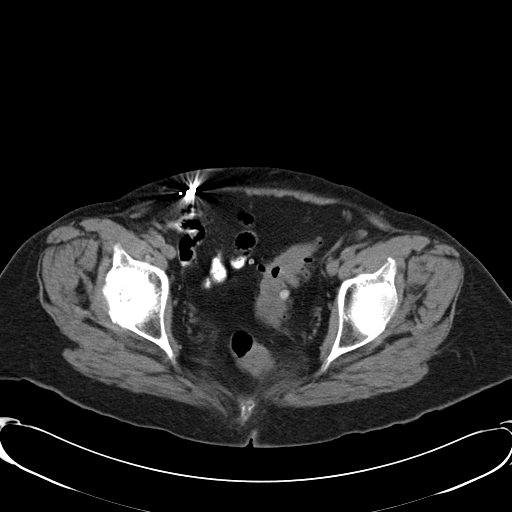
[im 21/79  soft-tissue]
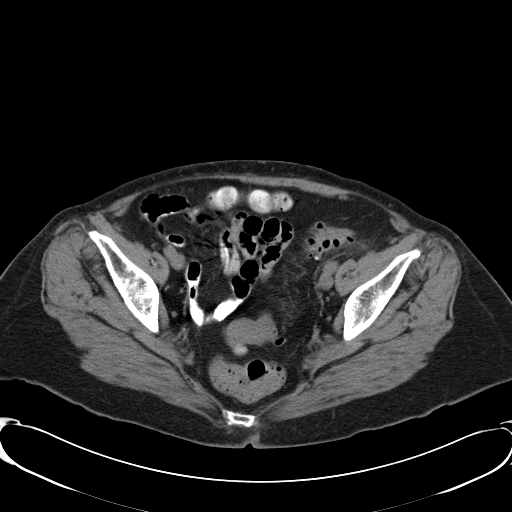
[im 28/79  soft-tissue]
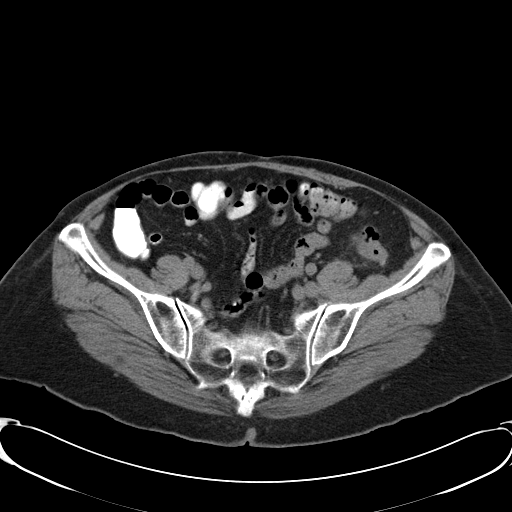
[im 34/79  soft-tissue]
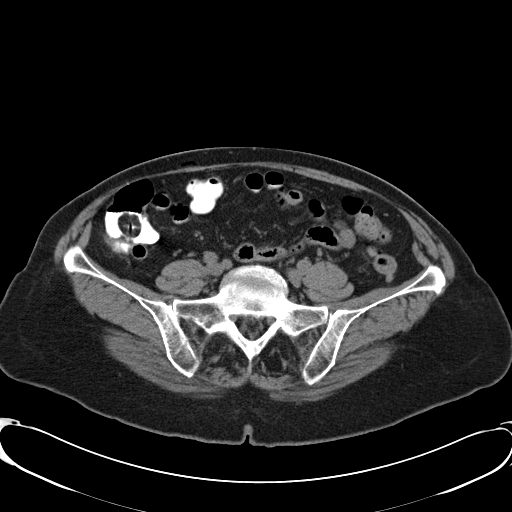
[im 41/79  soft-tissue]
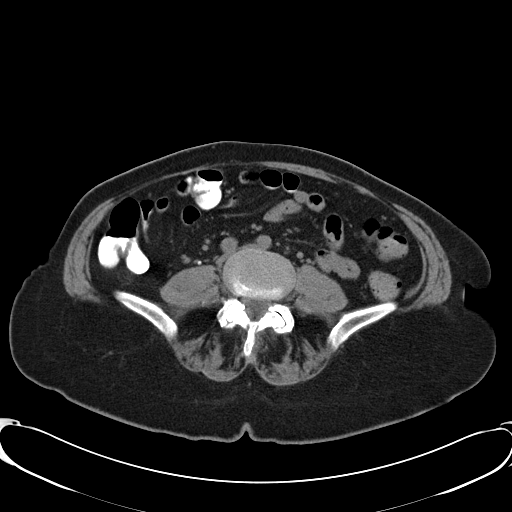
[im 45/79  soft-tissue]
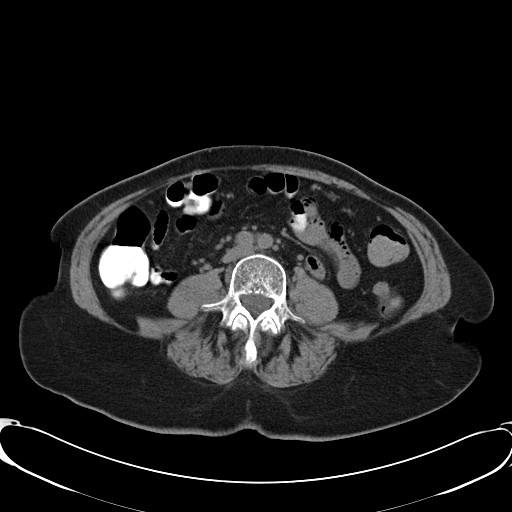
[im 51/79  soft-tissue]
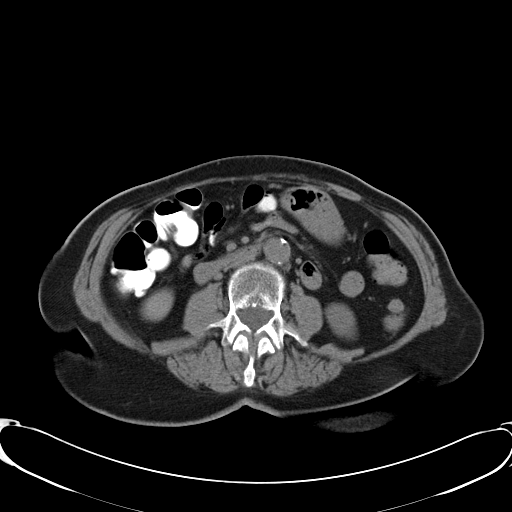
[im 51/79  bone]
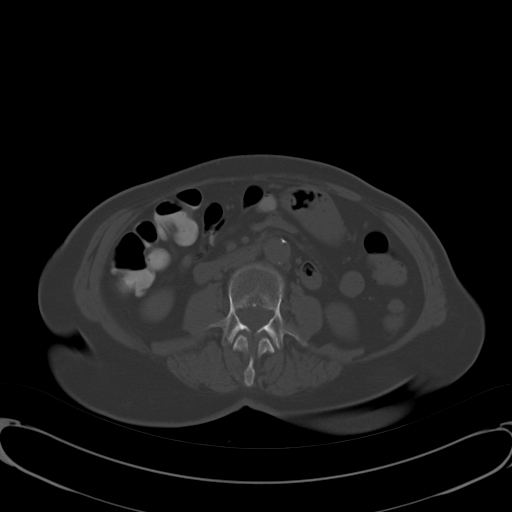
[im 58/79  soft-tissue]
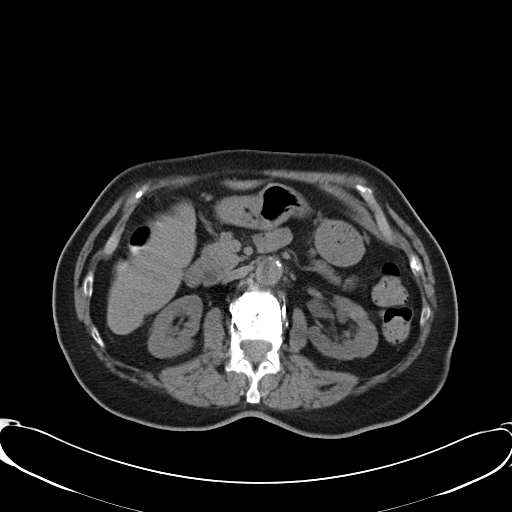
[im 62/79  soft-tissue]
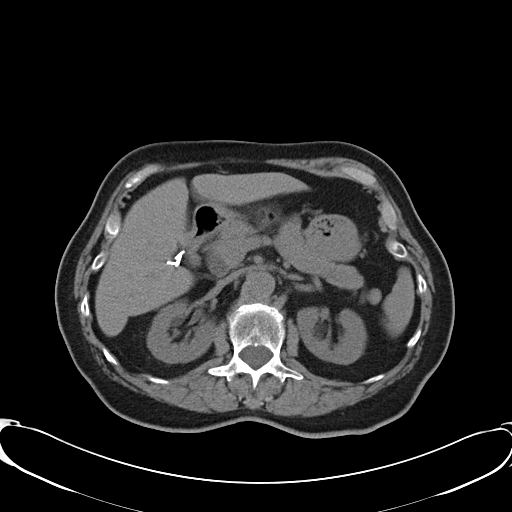
[im 68/79  soft-tissue]
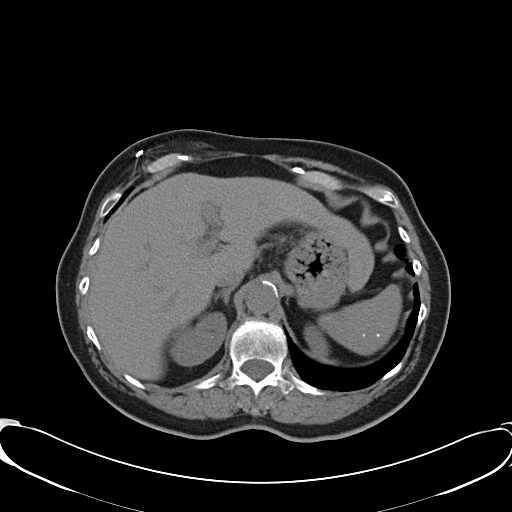
[im 75/79  soft-tissue]
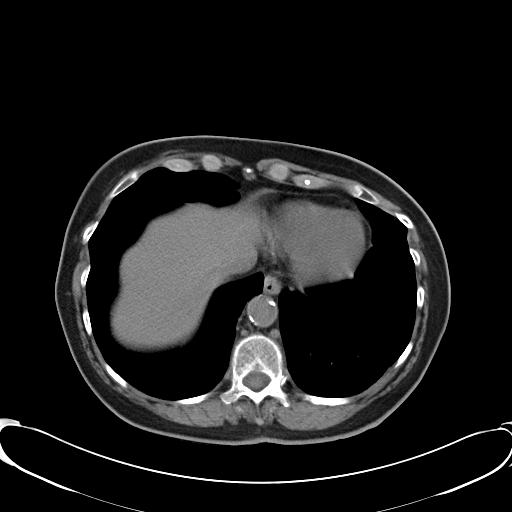

[Series 602: cor · coronal · 0.77mm/px · 3 of 101 slices shown]
[im 34/101  soft-tissue]
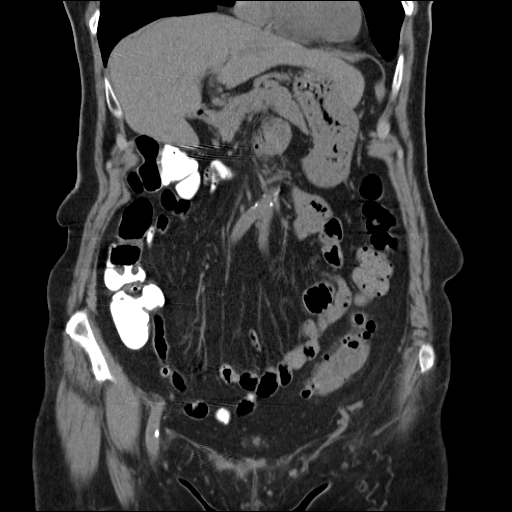
[im 45/101  soft-tissue]
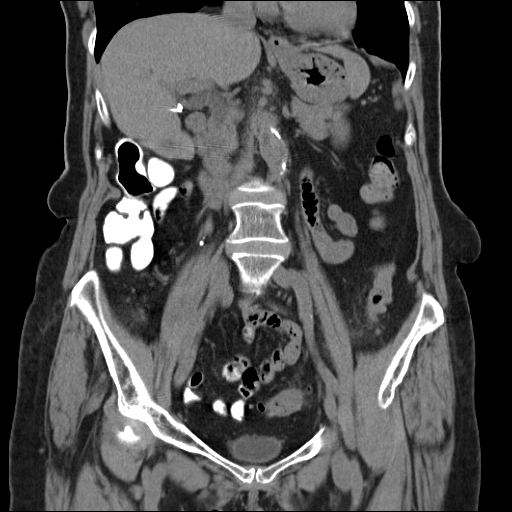
[im 56/101  soft-tissue]
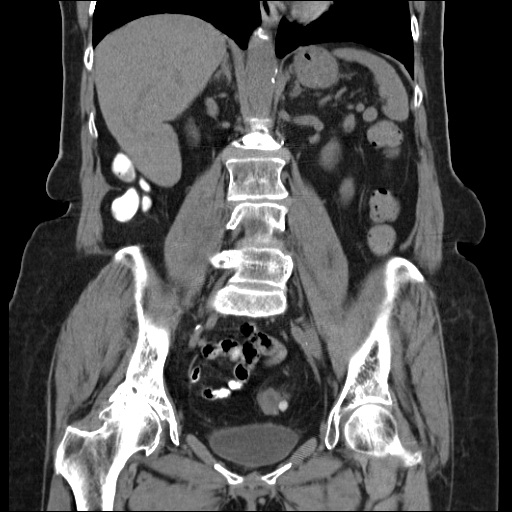

[16 of 46 positions shown; findings below may reference images not displayed]

FINDINGS: The visualized lung bases are clear.

The liver is unremarkable in appearance. Multiple calcified
granulomata are seen within the spleen.  The patient is status post
cholecystectomy, with clips noted at the gallbladder fossa.  The
pancreas and adrenal glands are unremarkable in appearance.  The
common hepatic duct measures 1.0 cm in diameter, within normal
limits status post cholecystectomy.

Small bilateral parapelvic cysts are seen.  The kidneys are
otherwise unremarkable in appearance.  There is no evidence of
hydronephrosis.  No renal or ureteral stones are seen.  No
perinephric stranding is appreciated.

No free fluid is identified.  The small bowel is unremarkable in
appearance.  The stomach is within normal limits.  No acute
vascular abnormalities are seen.  Scattered calcification is noted
along the abdominal aorta and its branches.

The patient is status post cholecystectomy.  Contrast progresses to
the level of the mildly redundant mid transverse colon.  Scattered
diverticulosis is noted along the descending and proximal sigmoid
colon.

Note is made of associated focal soft tissue inflammation along the
proximal sigmoid colon, with associated inflamed diverticula and
trace associated fluid.  Findings are compatible with acute
diverticulitis.  There is no evidence of perforation or abscess
formation.

The bladder is mildly distended and grossly unremarkable in
appearance.  The patient is status post hysterectomy; no suspicious
adnexal masses are seen.  No inguinal lymphadenopathy is seen.

No acute osseous abnormalities are identified.  The patient is
status post vertebroplasty at L2, with associated mild chronic
compression deformity.  There is mild grade 1 retrolisthesis of L2
on L3.
IMPRESSION: 1.  Acute diverticulitis involving the proximal sigmoid colon, with
focal soft tissue inflammation, and then diverticula and trace
associated fluid.  No evidence of perforation or abscess formation.
2.  Diverticulosis involves the descending and proximal sigmoid
colon.
3.  Small bilateral parapelvic renal cysts noted.
4.  Scattered calcification along the abdominal aorta and its
branches.
5.  Status post vertebroplasty at L2, with associated mild chronic
compression deformity, and mild grade 1 retrolisthesis of L2 on L3.

## 2012-07-04 ENCOUNTER — Other Ambulatory Visit: Payer: Self-pay | Admitting: Internal Medicine

## 2012-08-28 ENCOUNTER — Encounter: Payer: Self-pay | Admitting: Internal Medicine

## 2012-08-28 ENCOUNTER — Ambulatory Visit (INDEPENDENT_AMBULATORY_CARE_PROVIDER_SITE_OTHER): Payer: Medicare Other | Admitting: Internal Medicine

## 2012-08-28 ENCOUNTER — Other Ambulatory Visit (INDEPENDENT_AMBULATORY_CARE_PROVIDER_SITE_OTHER): Payer: Medicare Other

## 2012-08-28 VITALS — BP 122/72 | HR 98 | Temp 98.0°F | Resp 10 | Wt 147.0 lb

## 2012-08-28 DIAGNOSIS — R05 Cough: Secondary | ICD-10-CM

## 2012-08-28 DIAGNOSIS — R5381 Other malaise: Secondary | ICD-10-CM

## 2012-08-28 DIAGNOSIS — G609 Hereditary and idiopathic neuropathy, unspecified: Secondary | ICD-10-CM

## 2012-08-28 DIAGNOSIS — G319 Degenerative disease of nervous system, unspecified: Secondary | ICD-10-CM

## 2012-08-28 DIAGNOSIS — R5383 Other fatigue: Secondary | ICD-10-CM

## 2012-08-28 DIAGNOSIS — R279 Unspecified lack of coordination: Secondary | ICD-10-CM

## 2012-08-28 DIAGNOSIS — I679 Cerebrovascular disease, unspecified: Secondary | ICD-10-CM

## 2012-08-28 DIAGNOSIS — J310 Chronic rhinitis: Secondary | ICD-10-CM

## 2012-08-28 DIAGNOSIS — J45909 Unspecified asthma, uncomplicated: Secondary | ICD-10-CM

## 2012-08-28 DIAGNOSIS — R270 Ataxia, unspecified: Secondary | ICD-10-CM

## 2012-08-28 DIAGNOSIS — I10 Essential (primary) hypertension: Secondary | ICD-10-CM

## 2012-08-28 HISTORY — DX: Degenerative disease of nervous system, unspecified: G31.9

## 2012-08-28 HISTORY — DX: Cerebrovascular disease, unspecified: I67.9

## 2012-08-28 LAB — CBC WITH DIFFERENTIAL/PLATELET
Basophils Relative: 0.6 % (ref 0.0–3.0)
Eosinophils Absolute: 0.1 10*3/uL (ref 0.0–0.7)
Hemoglobin: 12.8 g/dL (ref 12.0–15.0)
Lymphocytes Relative: 38 % (ref 12.0–46.0)
MCHC: 34.1 g/dL (ref 30.0–36.0)
Monocytes Relative: 11.5 % (ref 3.0–12.0)
Neutro Abs: 3.2 10*3/uL (ref 1.4–7.7)
RBC: 4.07 Mil/uL (ref 3.87–5.11)

## 2012-08-28 LAB — SEDIMENTATION RATE: Sed Rate: 76 mm/hr — ABNORMAL HIGH (ref 0–22)

## 2012-08-28 NOTE — Patient Instructions (Addendum)
Allergy - the problem with asthma attacks and recurrent bronchitis, with a history of allergy, along with fatigue and malaise are indications for allergy evaluation. Plan -  Refer to Dr. Arletha Pili and gait problems along with dysequilibrium - need to rule out any brain related problem. Plan MRI brain without contrast  Weakness/malaise and lack of energy with increased somnolence: these can be symptoms related to allergy vs metabolic issues Plan Recheck thyroid and B12.  Allergy evaluation.  When all the data is back and if the symptoms of balance and cognition persist I think you will benefit from a neurology consult.

## 2012-08-28 NOTE — Progress Notes (Signed)
Subjective:    Patient ID: [name], female    DOB: [birth year], 79 y.o.   MRN: [id number]  HPI [name] went for [place] for the holidays. She had an episode of asthma/bronchitis that requireed steroids and treatment. She was seen by a local MD who is concerned for something of an allergic nature. She has been feeling very fatigued and low on energy. She only wants to be at home. She reports that she stays cold all the time. This is not her and she is concerned for some underlying process. She has trouble focusing and she has had some cognitive difficulties.  She has had a cough for 6-8 months of a non-productive cough - feels like something grabs her throat. She has had no chronic infectious symptoms. Question of allergy. Never smoked.  Chart reviewed: evaluated in May '12 for cognitive issues: normal MMSE, normal thyroid and B12 levels. Evaluated October '13 for syncope - negative CT brain, tele and labs.   Past Medical History  Diagnosis Date  . BREAST CANCER, HX OF 03/16/2007  . PEPTIC ULCER DISEASE 03/16/2007  . IRRITABLE BOWEL SYNDROME, HX OF 03/16/2007  . DEGENERATIVE JOINT DISEASE, CERVICAL SPINE 03/16/2007  . HYPERLIPIDEMIA 05/18/2007  . ANXIETY DEPRESSION 08/25/2009  . Essential hypertension, benign 10/28/2008  . CORONARY ATHEROSCLEROSIS NATIVE CORONARY ARTERY 10/28/2008  . CAROTID ARTERY STENOSIS 10/28/2008  . Chronic rhinitis 09/17/2008  . GERD 10/10/2007  . Diverticulitis of colon (without mention of hemorrhage) 03/16/2007  . CAD (coronary artery disease) 2005    s/p DES to LCX  . Cystitis   . Palpitations    Past Surgical History  Procedure Date  . Reconstruction breast w/ tram flap     right  . Hernia repair 2002    ventral hernia  . Finger arthroplasty 2002    Right thumb  . Abdominal hysterectomy   . Cholecystectomy   . Mastectomy   . Oophorectomy   . Varicose vein surgery    Family History  Problem Relation Age of Onset  . Cancer Father     Lung Cancer    . Asthma Other   . Cancer Other     breast  . COPD    . Heart disease Mother   . Heart disease Brother    History   Social History  . Marital Status: Widowed    Spouse Name: N/A    Number of Children: 5  . Years of Education: N/A   Occupational History  . Retired    Social History Main Topics  . Smoking status: Never Smoker   . Smokeless tobacco: Never Used  . Alcohol Use: No  . Drug Use: No  . Sexually Active: Not on file   Other Topics Concern  . Not on file   Social History Narrative   Has several children 3 dtrs, 2 sons, and several grandchildren in [place] and [address] She sees them often.Lives independently. Is very active    Current Outpatient Prescriptions on File Prior to Visit  Medication Sig Dispense Refill  . albuterol (PROVENTIL,VENTOLIN) 90 MCG/ACT inhaler Inhale 2 puffs into the lungs every 4 (four) hours as needed. Shortness of  breath      . alendronate (FOSAMAX) 70 MG tablet Take 70 mg by mouth every 7 (seven) days. Take with a full glass of water on an empty stomach. Fridays      . aspirin 81 MG tablet Take 81 mg by mouth daily.       Marland Kitchen  beclomethasone (QVAR) 40 MCG/ACT inhaler Inhale 2 puffs into the lungs 2 (two) times daily as needed. For shortness of breath      . cetirizine (ZYRTEC) 10 MG tablet Take 10 mg by mouth daily as needed. allergies      . fish oil-omega-3 fatty acids 1000 MG capsule 3 capsules by mouth once a day       . lisinopril (PRINIVIL,ZESTRIL) 20 MG tablet Take 1 tablet (20 mg total) by mouth daily.  90 tablet  3  . Multiple Vitamin (MULTIVITAMIN) capsule Take 1 capsule by mouth daily.        Marland Kitchen NEXIUM 40 MG capsule TAKE (1) CAPSULE DAILY.  90 capsule  1  . nitroGLYCERIN (NITROSTAT) 0.4 MG SL tablet Place 0.4 mg under the tongue every 5 (five) minutes as needed. Chest pains.      . ranitidine (ZANTAC) 150 MG tablet Take 150 mg by mouth at bedtime.       . rosuvastatin (CRESTOR) 5 MG tablet Take 5 mg by mouth daily.            Review of Systems System review is negative for any constitutional, cardiac, pulmonary, GI or neuro symptoms or complaints other than as described in the HPI.     Objective:   Physical Exam Filed Vitals:   08/28/12 1137  BP: 122/72  Pulse: 98  Temp: 98 F (36.7 C)  Resp: 10   Gen'l- WNWD, mildly overweight white woman in no distress HEENT- C&S clear, PERRLA Cor- RRR Pulm - normal respirations, no wheezing Neuro - A&O x 3, speech clear, cognition seems normal (not formally tested); CN II-XII normal; MS   Lab Results  Component Value Date   WBC 6.6 08/28/2012   HGB 12.8 08/28/2012   HCT 37.5 08/28/2012   PLT 286.0 08/28/2012   GLUCOSE 100* 06/05/2012   CHOL 181 07/04/2011   TRIG 95.0 07/04/2011   HDL 60.30 07/04/2011   LDLDIRECT 161.7 12/27/2010   LDLCALC 102* 07/04/2011   ALT 14 07/21/2011   AST 18 07/21/2011   NA 139 06/05/2012   K 4.7 06/05/2012   CL 105 06/05/2012   CREATININE 0.83 06/05/2012   BUN 15 06/05/2012   CO2 22 06/05/2012   TSH 2.27 08/28/2012   INR 1.1 06/14/2007   HGBA1C  Value: 6.1 09/22/2010        Sed Rate               79      Assessment & Plan:

## 2012-08-29 ENCOUNTER — Encounter: Payer: Self-pay | Admitting: Internal Medicine

## 2012-09-01 ENCOUNTER — Ambulatory Visit
Admission: RE | Admit: 2012-09-01 | Discharge: 2012-09-01 | Disposition: A | Discharge status: Home / Self Care | Payer: Medicare Other | Source: Ambulatory Visit | Attending: Internal Medicine | Admitting: Internal Medicine

## 2012-09-01 DIAGNOSIS — R270 Ataxia, unspecified: Secondary | ICD-10-CM

## 2012-09-01 NOTE — Assessment & Plan Note (Signed)
Exam with poor tandem gait but otherwise unremarkable.  Plan MRI brain - r/o cerebellar or brainstem injury.  May need neurology referral.

## 2012-09-01 NOTE — Assessment & Plan Note (Signed)
H/o chronic rhinitis and possibly allergy. Elevated sed rate is non-specific. Allergy flares at son's house [place] - and this can certainly contribute to weakness and malaise over all.  Plan  Refer to Dr. Maple Hudson for allergy evaluation.

## 2012-09-01 NOTE — Assessment & Plan Note (Signed)
Exam is unrevealing. Lab - thyroid function is normal, blood count is normal - no anemia, B12 level is normal. Sed rate is elevated at 76. Question if this is related to allergy vs arthritis vs myalgia/myositis.  Plan  Referral to Dr. Fannie Knee for allergy evaluation.  May consider prednisone if additional lab, e.g. CK is elevated.

## 2012-09-01 NOTE — Assessment & Plan Note (Signed)
BP Readings from Last 3 Encounters:  08/28/12 122/72  06/06/12 133/91  03/13/12 114/68   Good control on present regimen

## 2012-09-05 ENCOUNTER — Encounter: Payer: Self-pay | Admitting: Internal Medicine

## 2012-09-18 ENCOUNTER — Encounter: Payer: Self-pay | Admitting: Internal Medicine

## 2012-09-19 ENCOUNTER — Encounter: Payer: Self-pay | Admitting: Internal Medicine

## 2012-09-19 ENCOUNTER — Ambulatory Visit (INDEPENDENT_AMBULATORY_CARE_PROVIDER_SITE_OTHER): Payer: Medicare Other | Admitting: Internal Medicine

## 2012-09-19 VITALS — BP 140/78 | HR 112 | Temp 97.4°F | Resp 16 | Wt 145.0 lb

## 2012-09-19 DIAGNOSIS — J069 Acute upper respiratory infection, unspecified: Secondary | ICD-10-CM

## 2012-09-19 DIAGNOSIS — H6690 Otitis media, unspecified, unspecified ear: Secondary | ICD-10-CM

## 2012-09-19 MED ORDER — AZITHROMYCIN 250 MG PO TABS: ORAL_TABLET | ORAL | Status: DC

## 2012-09-19 MED ORDER — BENZONATATE 100 MG PO CAPS: 100.0000 mg | ORAL_CAPSULE | Freq: Three times a day (TID) | ORAL | Status: DC | PRN

## 2012-09-19 NOTE — Patient Instructions (Addendum)
Otitis Media - middle ear infection as well as an upper respiratory infection.  Plan z-pak (antibiotic)  Tessalon perles three times a day for cough  Robitussin DM or the equivalent for cough  Hydrate, Vitamin C, Tylenol for fever or aches.

## 2012-09-19 NOTE — Progress Notes (Signed)
Subjective:    Patient ID: [name], female    DOB: 1930-05-09, 79 y.o.   MRN: [id number]  HPI [name] presents with a 5 day h/o sinus congestion, pressure pain, cough that is non-productive, she does have some throat pain and chest wall pain from coughing. Mild nausea but no emesis. She has had good results with ginger. No fever. Generally feeling weak.   Past Medical History  Diagnosis Date  . BREAST CANCER, HX OF 03/16/2007  . PEPTIC ULCER DISEASE 03/16/2007  . IRRITABLE BOWEL SYNDROME, HX OF 03/16/2007  . DEGENERATIVE JOINT DISEASE, CERVICAL SPINE 03/16/2007  . HYPERLIPIDEMIA 05/18/2007  . ANXIETY DEPRESSION 08/25/2009  . Essential hypertension, benign 10/28/2008  . CORONARY ATHEROSCLEROSIS NATIVE CORONARY ARTERY 10/28/2008  . CAROTID ARTERY STENOSIS 10/28/2008  . Chronic rhinitis 09/17/2008  . GERD 10/10/2007  . Diverticulitis of colon (without mention of hemorrhage) 03/16/2007  . CAD (coronary artery disease) 2005    s/p DES to LCX  . Cystitis   . Palpitations    Past Surgical History  Procedure Date  . Reconstruction breast w/ tram flap     right  . Hernia repair 2002    ventral hernia  . Finger arthroplasty 2002    Right thumb  . Abdominal hysterectomy   . Cholecystectomy   . Mastectomy   . Oophorectomy   . Varicose vein surgery    Family History  Problem Relation Age of Onset  . Cancer Father     Lung Cancer  . Asthma Other   . Cancer Other     breast  . COPD    . Heart disease Mother   . Heart disease Brother    History   Social History  . Marital Status: Widowed    Spouse Name: N/A    Number of Children: 5  . Years of Education: N/A   Occupational History  . Retired    Social History Main Topics  . Smoking status: Never Smoker   . Smokeless tobacco: Never Used  . Alcohol Use: No  . Drug Use: No  . Sexually Active: Not on file   Other Topics Concern  . Not on file   Social History Narrative   Has several children 3 dtrs, 2 sons, and several  grandchildren in [place] and [address] She sees them often.Lives independently. Is very active    Current Outpatient Prescriptions on File Prior to Visit  Medication Sig Dispense Refill  . albuterol (PROVENTIL,VENTOLIN) 90 MCG/ACT inhaler Inhale 2 puffs into the lungs every 4 (four) hours as needed. Shortness of  breath      . alendronate (FOSAMAX) 70 MG tablet Take 70 mg by mouth every 7 (seven) days. Take with a full glass of water on an empty stomach. Fridays      . aspirin 81 MG tablet Take 81 mg by mouth daily.       . beclomethasone (QVAR) 40 MCG/ACT inhaler Inhale 2 puffs into the lungs 2 (two) times daily as needed. For shortness of breath      . cetirizine (ZYRTEC) 10 MG tablet Take 10 mg by mouth daily as needed. allergies      . fish oil-omega-3 fatty acids 1000 MG capsule 3 capsules by mouth once a day       . lisinopril (PRINIVIL,ZESTRIL) 20 MG tablet Take 1 tablet (20 mg total) by mouth daily.  90 tablet  3  . Multiple Vitamin (MULTIVITAMIN) capsule Take 1 capsule by mouth daily.        Marland Kitchen  NEXIUM 40 MG capsule TAKE (1) CAPSULE DAILY.  90 capsule  1  . nitroGLYCERIN (NITROSTAT) 0.4 MG SL tablet Place 0.4 mg under the tongue every 5 (five) minutes as needed. Chest pains.      . ranitidine (ZANTAC) 150 MG tablet Take 150 mg by mouth at bedtime.       . rosuvastatin (CRESTOR) 5 MG tablet Take 5 mg by mouth daily.           Review of Systems System review is negative for any constitutional, cardiac, pulmonary, GI or neuro symptoms or complaints other than as described in the HPI.    Objective:   Physical Exam .vitals Wt Readings from Last 3 Encounters:  09/19/12 145 lb (65.772 kg)  08/28/12 147 lb 0.6 oz (66.697 kg)  06/06/12 140 lb 14 oz (63.9 kg)   Gen'l - WNWD white woman in no distress HEENT- Left TM red and angry, Throat clear Cor - RRR Pulm - good breath sounds, no rales or wheezes         Assessment & Plan:  Otitis Media - middle ear infection as well as an  upper respiratory infection.  Plan z-pak (antibiotic)  Tessalon perles three times a day for cough  Robitussin DM or the equivalent for cough  Hydrate, Vitamin C, Tylenol for fever or aches.

## 2012-10-02 ENCOUNTER — Ambulatory Visit (INDEPENDENT_AMBULATORY_CARE_PROVIDER_SITE_OTHER): Payer: Medicare Other | Admitting: Internal Medicine

## 2012-10-02 ENCOUNTER — Encounter: Payer: Self-pay | Admitting: Internal Medicine

## 2012-10-02 ENCOUNTER — Other Ambulatory Visit: Payer: Medicare Other

## 2012-10-02 VITALS — BP 138/82 | HR 90 | Ht 63.0 in | Wt 149.0 lb

## 2012-10-02 DIAGNOSIS — R0609 Other forms of dyspnea: Secondary | ICD-10-CM

## 2012-10-02 DIAGNOSIS — J45909 Unspecified asthma, uncomplicated: Secondary | ICD-10-CM

## 2012-10-02 NOTE — Patient Instructions (Addendum)
Try otc caffeine tablet- 1/2 or 1 tab, once or twice daily if needed for tiredness  It is ok to take a nap if needed  Order- lab- Allergy profile   Dx allergic bronchitis  Stop the lisinopril now, for one month. See if that affects your dry cough.

## 2012-10-02 NOTE — Progress Notes (Signed)
10/01/12- 82 yoF never smoker seen in allergy consultation at kind request of Dr Debby Bud. Complains of feeling tired all the time wants to stay home in sleep. Visiting [place], had asthma/bronchitis each time she visited son's family in [place],. [facility]-can't walk halls as before because she feels tired. No day-to-day variation. Naps do help some. Falls asleep watching TV. Sleeps well. Bedtime 10:30 or 11 PM, up at 8 AM.no snoring. Dry cough. Benzonatate helps some.  Prior to Admission medications   Medication Sig Start Date End Date Taking? Authorizing Provider  albuterol (PROVENTIL,VENTOLIN) 90 MCG/ACT inhaler Inhale 2 puffs into the lungs every 4 (four) hours as needed. Shortness of  breath   Yes Historical Provider, MD  alendronate (FOSAMAX) 70 MG tablet Take 70 mg by mouth every 7 (seven) days. Take with a full glass of water on an empty stomach. Fridays   Yes Historical Provider, MD  aspirin 81 MG tablet Take 81 mg by mouth daily.    Yes Historical Provider, MD  beclomethasone (QVAR) 40 MCG/ACT inhaler Inhale 2 puffs into the lungs 2 (two) times daily as needed. For shortness of breath   Yes Historical Provider, MD  benzonatate (TESSALON) 100 MG capsule Take 1 capsule (100 mg total) by mouth 3 (three) times daily as needed for cough. 09/19/12  Yes Jacques Navy, MD  cetirizine (ZYRTEC) 10 MG tablet Take 10 mg by mouth daily as needed. allergies   Yes Historical Provider, MD  lisinopril (PRINIVIL,ZESTRIL) 20 MG tablet Take 1 tablet (20 mg total) by mouth daily. 03/14/12  Yes Lewayne Bunting, MD  Multiple Vitamin (MULTIVITAMIN) capsule Take 1 capsule by mouth daily.     Yes Historical Provider, MD  NEXIUM 40 MG capsule TAKE (1) CAPSULE DAILY. 07/04/12  Yes Jacques Navy, MD  nitroGLYCERIN (NITROSTAT) 0.4 MG SL tablet Place 0.4 mg under the tongue every 5 (five) minutes as needed. Chest pains.   Yes Historical Provider, MD  ranitidine (ZANTAC) 150 MG tablet Take 150 mg by  mouth at bedtime.    Yes Historical Provider, MD   Past Medical History  Diagnosis Date  . BREAST CANCER, HX OF 03/16/2007  . PEPTIC ULCER DISEASE 03/16/2007  . IRRITABLE BOWEL SYNDROME, HX OF 03/16/2007  . DEGENERATIVE JOINT DISEASE, CERVICAL SPINE 03/16/2007  . HYPERLIPIDEMIA 05/18/2007  . ANXIETY DEPRESSION 08/25/2009  . Essential hypertension, benign 10/28/2008  . CORONARY ATHEROSCLEROSIS NATIVE CORONARY ARTERY 10/28/2008  . CAROTID ARTERY STENOSIS 10/28/2008  . Chronic rhinitis 09/17/2008  . GERD 10/10/2007  . Diverticulitis of colon (without mention of hemorrhage) 03/16/2007  . CAD (coronary artery disease) 2005    s/p DES to LCX  . Cystitis   . Palpitations    Past Surgical History  Procedure Laterality Date  . Reconstruction breast w/ tram flap      right  . Hernia repair  2002    ventral hernia  . Finger arthroplasty  2002    Right thumb  . Abdominal hysterectomy    . Cholecystectomy    . Mastectomy    . Oophorectomy    . Varicose vein surgery     Family History  Problem Relation Age of Onset  . Cancer Father     Lung Cancer  . Asthma Other   . Cancer Other     breast  . COPD    . Heart disease Mother   . Heart disease Brother    History   Social History  . Marital Status:  Widowed    Spouse Name: N/A    Number of Children: 5  . Years of Education: N/A   Occupational History  . Retired    Social History Main Topics  . Smoking status: Never Smoker   . Smokeless tobacco: Never Used  . Alcohol Use: No  . Drug Use: No  . Sexually Active: Not on file   Other Topics Concern  . Not on file   Social History Narrative   Has several children 3 dtrs, 2 sons, and several grandchildren in [place] and [address] She sees them often.   Lives independently. Is very active   ROS-see HPI Constitutional:   No-   weight loss, night sweats, fevers, chills, fatigue, lassitude. HEENT:   No-  headaches, difficulty swallowing, tooth/dental problems, +sore throat,       No-   sneezing, itching, +ear ache, nasal congestion, post nasal drip,  CV:  No-   chest pain, orthopnea, PND, swelling in lower extremities, anasarca,                                  dizziness, +palpitations Resp: +  shortness of breath with exertion or at rest.              No-   productive cough,  No non-productive cough,  No- coughing up of blood.              No-   change in color of mucus.  No- wheezing.   Skin: No-   rash or lesions. GI:  +  heartburn, indigestion, abdominal pain, nausea, vomiting, diarrhea,                 change in bowel habits, loss of appetite GU: No-   dysuria, change in color of urine, no urgency or frequency.  No- flank pain. MS:  No-   joint pain or swelling.  No- decreased range of motion.  No- back pain. Neuro-     nothing unusual Psych:  No- change in mood or affect. No depression or anxiety.  No memory loss.  OBJ- Physical Exam General- Alert, Oriented, Affect-appropriate, Distress- none acute. Medium build. Skin- rash-none, lesions- none, excoriation- none Lymphadenopathy- none Head- atraumatic            Eyes- Gross vision intact, PERRLA, conjunctivae and secretions clear            Ears- Hearing, canals-normal            Nose- Clear, no-Septal dev, mucus, polyps, erosion, perforation             Throat- Mallampati II , mucosa clear , drainage- none, tonsils- atrophic Neck- flexible , trachea midline, no stridor , thyroid nl, carotid no bruit Chest - symmetrical excursion , unlabored           Heart/CV- RRR , no murmur , no gallop  , no rub, nl s1 s2                           - JVD- none , edema- none, stasis changes- none, varices- none           Lung- clear to P&A, wheeze- none, +paroxysmal dry cough , dullness-none, rub- none           Chest wall-  Abd- tender-no, distended-no, bowel sounds-present, HSM- no Br/ Gen/ Rectal- Not done, not  indicated Extrem- cyanosis- none, clubbing, none, atrophy- none, strength- nl Neuro- grossly intact to  observation

## 2012-10-03 LAB — ALLERGY FULL PROFILE
Allergen,Goose feathers, e70: <0.1 kU/L
Alternaria Alternata: <0.1 kU/L
Aspergillus fumigatus, m3: <0.1 kU/L
Bermuda Grass: <0.1 kU/L
Candida Albicans: <0.1 kU/L
D. farinae: <0.1 kU/L
Dog Dander: <0.1 kU/L
Goldenrod: <0.1 kU/L
Helminthosporium halodes: <0.1 kU/L
IgE (Immunoglobulin E), Serum: 9.6 IU/mL (ref 0.0–180.0)
Lamb's Quarters: <0.1 kU/L
Oak: <0.1 kU/L
Plantain: <0.1 kU/L
Stemphylium Botryosum: <0.1 kU/L
Timothy Grass: <0.1 kU/L

## 2012-10-07 ENCOUNTER — Encounter: Payer: Self-pay | Admitting: Internal Medicine

## 2012-10-07 NOTE — Assessment & Plan Note (Signed)
Tiredness with dry cough. Plan-stop lisinopril for one month. Allergy profile. Try to sort out symptoms

## 2012-10-08 ENCOUNTER — Encounter: Payer: Self-pay | Admitting: Internal Medicine

## 2012-10-09 ENCOUNTER — Encounter: Payer: Self-pay | Admitting: Internal Medicine

## 2012-11-08 ENCOUNTER — Ambulatory Visit (INDEPENDENT_AMBULATORY_CARE_PROVIDER_SITE_OTHER): Payer: Medicare Other | Admitting: Internal Medicine

## 2012-11-08 ENCOUNTER — Encounter: Payer: Self-pay | Admitting: Internal Medicine

## 2012-11-08 VITALS — BP 130/90 | HR 103 | Ht 63.0 in | Wt 150.8 lb

## 2012-11-08 DIAGNOSIS — R05 Cough: Secondary | ICD-10-CM

## 2012-11-08 DIAGNOSIS — R1310 Dysphagia, unspecified: Secondary | ICD-10-CM

## 2012-11-08 DIAGNOSIS — R5383 Other fatigue: Secondary | ICD-10-CM

## 2012-11-08 DIAGNOSIS — Z23 Encounter for immunization: Secondary | ICD-10-CM

## 2012-11-08 DIAGNOSIS — J45909 Unspecified asthma, uncomplicated: Secondary | ICD-10-CM

## 2012-11-08 NOTE — Patient Instructions (Addendum)
Order- schedule PFT    Dx asthma  Order- schedule speech therapy assisted modified barium swallow      Dx dysphagia with cough   Try 1/4 caffeine tab as needed for sleepiness  TDAP  Pneumovax

## 2012-11-08 NOTE — Progress Notes (Signed)
10/01/12- 82 yoF never smoker seen in allergy consultation at kind request of Dr Debby Bud. Complains of feeling tired all the time wants to stay home in sleep. Visiting [place], had asthma/bronchitis each time she visited son's family in [place],. Lives at [facility]-can't walk halls as before because she feels tired. No day-to-day variation. Naps do help some. Falls asleep watching TV. Sleeps well. Bedtime 10:30 or 11 PM, up at 8 AM.no snoring. Dry cough. Benzonatate helps some.  11/08/12- 82 yoF never smoker seen in allergy consultation at kind request of Dr Debby Bud. Follows for: pt here to go over allergy test.pt also complaing of sob,wheezing,due to allergies. denies any cough.  Dr Debby Bud office asks we do her TDAP and pneumovax while here. Allergy profile- 10/02/12- Total IgE 9.6, negative for all agents tested. Tried off lisinopril for one month with no effect on dry cough so she restarted. Has pill dysphagia and may cough until she retches. Still expects to get asthma within 3 days when visiting family with grandchildren and pets in [place].  ROS-see HPI Constitutional:   No-   weight loss, night sweats, fevers, chills, fatigue, lassitude. HEENT:   No-  headaches, difficulty swallowing, tooth/dental problems, +sore throat,       No-  sneezing, itching, ear ache, nasal congestion, post nasal drip,  CV:  No-   chest pain, orthopnea, PND, swelling in lower extremities, anasarca,                                  dizziness, +palpitations Resp: +  shortness of breath with exertion or at rest.              No-   productive cough,  + non-productive cough,  No- coughing up of blood.              No-   change in color of mucus.  + wheezing.   Skin: No-   rash or lesions. GI:  +  heartburn, indigestion, abdominal pain, nausea, vomiting,  GU:  MS:  No-   joint pain or swelling.   Neuro-     nothing unusual Psych:  No- change in mood or affect. No depression or anxiety.  No memory loss.  OBJ-  Physical Exam General- Alert, Oriented, Affect-appropriate, Distress- none acute. Medium build. Skin- rash-none, lesions- none, excoriation- none Lymphadenopathy- none Head- atraumatic            Eyes- Gross vision intact, PERRLA, conjunctivae and secretions clear            Ears- Hearing, canals-normal            Nose- Clear, no-Septal dev, mucus, polyps, erosion, perforation             Throat- Mallampati II , mucosa red , drainage- none, tonsils- atrophic Neck- flexible , trachea midline, no stridor , thyroid nl, carotid no bruit Chest - symmetrical excursion , unlabored           Heart/CV- RRR , no murmur , no gallop  , no rub, nl s1 s2                           - JVD- none , edema- none, stasis changes- none, varices- none           Lung- clear to P&A, wheeze- none, + dry cough , dullness-none, rub- none  Chest wall-  Abd-  Br/ Gen/ Rectal- Not done, not indicated Extrem- cyanosis- none, clubbing, none, atrophy- none, strength- nl Neuro- grossly intact to observation

## 2012-11-09 ENCOUNTER — Other Ambulatory Visit (HOSPITAL_COMMUNITY): Payer: Self-pay | Admitting: Internal Medicine

## 2012-11-09 DIAGNOSIS — R1310 Dysphagia, unspecified: Secondary | ICD-10-CM

## 2012-11-13 ENCOUNTER — Ambulatory Visit (HOSPITAL_COMMUNITY)
Admission: RE | Admit: 2012-11-13 | Discharge: 2012-11-13 | Disposition: A | Discharge status: Home / Self Care | Payer: Medicare Other | Source: Ambulatory Visit | Attending: Internal Medicine | Admitting: Internal Medicine

## 2012-11-13 ENCOUNTER — Telehealth: Payer: Self-pay | Admitting: Internal Medicine

## 2012-11-13 DIAGNOSIS — M5380 Other specified dorsopathies, site unspecified: Secondary | ICD-10-CM | POA: Insufficient documentation

## 2012-11-13 DIAGNOSIS — J449 Chronic obstructive pulmonary disease, unspecified: Secondary | ICD-10-CM | POA: Insufficient documentation

## 2012-11-13 DIAGNOSIS — R1310 Dysphagia, unspecified: Secondary | ICD-10-CM

## 2012-11-13 DIAGNOSIS — Z8701 Personal history of pneumonia (recurrent): Secondary | ICD-10-CM | POA: Insufficient documentation

## 2012-11-13 DIAGNOSIS — K219 Gastro-esophageal reflux disease without esophagitis: Secondary | ICD-10-CM | POA: Insufficient documentation

## 2012-11-13 DIAGNOSIS — R05 Cough: Secondary | ICD-10-CM | POA: Insufficient documentation

## 2012-11-13 DIAGNOSIS — R1110 Vomiting, unspecified: Secondary | ICD-10-CM | POA: Insufficient documentation

## 2012-11-13 DIAGNOSIS — J029 Acute pharyngitis, unspecified: Secondary | ICD-10-CM | POA: Insufficient documentation

## 2012-11-13 DIAGNOSIS — J4489 Other specified chronic obstructive pulmonary disease: Secondary | ICD-10-CM | POA: Insufficient documentation

## 2012-11-13 DIAGNOSIS — R6889 Other general symptoms and signs: Secondary | ICD-10-CM | POA: Insufficient documentation

## 2012-11-13 DIAGNOSIS — R059 Cough, unspecified: Secondary | ICD-10-CM | POA: Insufficient documentation

## 2012-11-13 NOTE — Telephone Encounter (Signed)
Just verifing MBS and not BS  Yes dr young does want MBS Tobe Sos

## 2012-11-13 NOTE — Procedures (Signed)
Objective Swallowing Evaluation: Modified Barium Swallowing Study  Patient Details  Name: [name] MRN: [id number] Date of Birth: [birth year]  Today's Date: 11/13/2012 Time: 7829-5621 SLP Time Calculation (min): 57 min  Past Medical History:  Past Medical History  Diagnosis Date  . BREAST CANCER, HX OF 03/16/2007  . PEPTIC ULCER DISEASE 03/16/2007  . IRRITABLE BOWEL SYNDROME, HX OF 03/16/2007  . DEGENERATIVE JOINT DISEASE, CERVICAL SPINE 03/16/2007  . HYPERLIPIDEMIA 05/18/2007  . ANXIETY DEPRESSION 08/25/2009  . Essential hypertension, benign 10/28/2008  . CORONARY ATHEROSCLEROSIS NATIVE CORONARY ARTERY 10/28/2008  . CAROTID ARTERY STENOSIS 10/28/2008  . Chronic rhinitis 09/17/2008  . GERD 10/10/2007  . Diverticulitis of colon (without mention of hemorrhage) 03/16/2007  . CAD (coronary artery disease) 2005    s/p DES to LCX  . Cystitis   . Palpitations    Past Surgical History:  Past Surgical History  Procedure Laterality Date  . Reconstruction breast w/ tram flap      right  . Hernia repair  2002    ventral hernia  . Finger arthroplasty  2002    Right thumb  . Abdominal hysterectomy    . Cholecystectomy    . Mastectomy    . Oophorectomy    . Varicose vein surgery     HPI:  79 yo female referred by Dr Maple Hudson for MBS due to pt complaint of chronic cough.  Pt per referral form + for pna, breathing difficulty, dry cough, breast cancer s/p masectomy - no radiation or chemo per pt, GERD, COPD-asthma, arthritis.  Pt reports having undergone an MRI January 2014 which was negative due to vertigo, syncope.  She further reports recurrent bronchitis, asthma episodes when she visits her children in [facility].  Pt also reports h/o seasonal allergies for which she takes Zyrtec and Allegra alternately.  Pt denies overt chronic dysphagia but does report episode of choking on a vitamin C non-coated tablet recently that caused her to cough excessively and made her throat sore.  Xerostomia also reported by  pt.   PPI use x 5-6 years reported by pt, currently she takes Nexium.  Per pt, MD ceased Lisinopril to determine if decreased cough but pt denies improvement.  Pt also states she has coughed to point of regurgitation of food particles 4-5 times since October 2013- usually occurs hours after eating when pt is reclined.  She denies requiring heimlich manuever and denies overt coughing during meals.       Assessment / Plan / Recommendation Clinical Impression  Dysphagia Diagnosis: Within Functional Limits;Mild cervical esophageal phase dysphagia Clinical impression:   Functional oropharyngeal swallow ability noted without aspiration or penetration of any consistency tested.     Suspect pt has mild cervical esophageal phase dysphagia, ? impaired timing of UES opening resulted in mild stasis of liquids- radiologist not present to confirm findings.  Barium tablet lodged just below cp - further boluses of thin aided proximal esophageal clearance but tablet appeared to lodge again at LES with referrant sensation to pharynx.  Pt required applesauce bolus to clear tablet into stomach, radiologist not present to confirm.  Please note, pt observed to clear her throat multiple times during testing once barium was introduced- but no barium was observed in larynx/trachea.    Reflux Symptom Index given with pt scoring 30/45 indicating high likelihood of LPR that may contribute to her symptoms.   Provided pt with aspiration/reflux precautions, RSI to use to rescore and xerostomia compensations.   Thanks for this referral.  Treatment Recommendation    n/a all education completed   Diet Recommendation Thin liquid;Regular (extra gravy and sauce)   Liquid Administration via: Straw;Cup Medication Administration: Whole meds with liquid (take wth pudding follow with water if helpful) or chewable, etc Supervision: Patient able to self feed Compensations: Slow rate;Small sips/bites;Follow solids with liquid, several  small meals/day   Other  Recommendations Oral Care Recommendations: Oral care QID   Follow Up Recommendations    defer to referring MD        General HPI: 79 yo female referred by Dr Maple Hudson for MBS due to pt complaint of chronic cough.  Pt per referral form + for pna, breathing difficulty, dry cough, breast cancer s/p masectomy - no radiation or chemo per pt, GERD, COPD-asthma, arthritis.  Pt reports having undergone an MRI January 2014 which was negative due to vertigo, syncope.  She further reports recurrent bronchitis, asthma episodes when she visits her children in [facility].  Pt also reports h/o seasonal allergies for which she takes Zyrtec and Allegra alternately.  Pt denies overt chronic dysphagia but does report episode of choking on a vitamin C non-coated tablet recently that caused her to cough excessively and made her throat sore.  Xerostomia also reported by pt.   PPI use x 5-6 years reported by pt, currently she takes Nexium.  Per pt, MD ceased Lisinopril to determine if decreased cough but pt denies improvement.  Pt also states she has coughed to point of regurgitation of food particles 4-5 times since October 2013- usually occurs hours after eating when pt is reclined.  She denies requiring heimlich manuever and denies overt coughing during meals.   Type of Study: Modified Barium Swallowing Study Reason for Referral: Objectively evaluate swallowing function Diet Prior to this Study: Regular;Thin liquids Behavior/Cognition: Alert;Cooperative;Pleasant mood Oral Cavity - Dentition: Adequate natural dentition Oral Motor / Sensory Function: Within functional limits Self-Feeding Abilities: Able to feed self Patient Positioning: Upright in bed Volitional Cough: Strong Volitional Swallow: Able to elicit Anatomy: Within functional limits (appearance of osteophyte C5-C6)    Reason for Referral Objectively evaluate swallowing function   Oral Phase Oral Preparation/Oral Phase Oral Phase: WFL    Pharyngeal Phase Pharyngeal Phase Pharyngeal Phase: Impaired Pharyngeal Phase - Comment Pharyngeal Comment: pt with mild residuals of liquids at pyriform sinus at times, not consistent, appeared to mix with possible retained secretions  Cervical Esophageal Phase    GO    Cervical Esophageal Phase Cervical Esophageal Phase: Impaired Cervical Esophageal Phase - Nectar Nectar Cup: Reduced cricopharyngeal relaxation Cervical Esophageal Phase - Thin Thin Cup: Reduced cricopharyngeal relaxation Cervical Esophageal Phase - Solids Puree: Reduced cricopharyngeal relaxation Regular: Reduced cricopharyngeal relaxation Pill: Reduced cricopharyngeal relaxation (barium tablet appeared to lodge below UES, thin assist clear) Cervical Esophageal Phase - Comment Cervical Esophageal Comment: suspect decreased UES clearance impacting pt's swallowing- minimal retention with liquids at cp at times, solid cracker and pudding transited easily, thin, nectar, pudding, cracker appeared to clear esophagus without a delay, Barium tablet lodged just below cp - further thin aided proximal esophageal clearance but tablet appeared to lodge at LES with referrant sensation to pharynx - required applesauce to clear, radiologist not present to confirm    Functional Assessment Tool Used: mbs, clinical  judgement Functional Limitations: Swallowing Swallow Current Status (B2841): At least 1 percent but less than 20 percent impaired, limited or restricted Swallow Goal Status [phone]): At least 1 percent but less than 20 percent impaired, limited or restricted Swallow Discharge Status [phone]):  At least 1 percent but less than 20 percent impaired, limited or restricted    Donavan Burnet, MS [facility] SLP [phone]

## 2012-11-14 ENCOUNTER — Other Ambulatory Visit (HOSPITAL_COMMUNITY): Payer: Self-pay | Admitting: Internal Medicine

## 2012-11-14 DIAGNOSIS — R05 Cough: Secondary | ICD-10-CM

## 2012-11-14 DIAGNOSIS — R1310 Dysphagia, unspecified: Secondary | ICD-10-CM

## 2012-11-15 DIAGNOSIS — R05 Cough: Secondary | ICD-10-CM | POA: Insufficient documentation

## 2012-11-15 NOTE — Assessment & Plan Note (Signed)
Very nonspecific. I can't tie her complaint specifically to sleepiness poor sleep habits. I suggested she try fractions of a caffeine tablet.

## 2012-11-15 NOTE — Assessment & Plan Note (Signed)
Lisinopril made no difference when stopped for one month. She describes episodic asthma when visiting her grandchildren-either some allergic or irritant trigger in that home, or she quickly catches viral infections. Plan-modified barium swallow, PFT

## 2012-11-16 DIAGNOSIS — R1310 Dysphagia, unspecified: Secondary | ICD-10-CM | POA: Insufficient documentation

## 2012-11-16 NOTE — Progress Notes (Signed)
Quick Note:  Pt aware of results. ______ 

## 2012-11-26 ENCOUNTER — Ambulatory Visit (INDEPENDENT_AMBULATORY_CARE_PROVIDER_SITE_OTHER): Payer: Medicare Other | Admitting: Internal Medicine

## 2012-11-26 ENCOUNTER — Other Ambulatory Visit (INDEPENDENT_AMBULATORY_CARE_PROVIDER_SITE_OTHER): Payer: Medicare Other

## 2012-11-26 ENCOUNTER — Encounter: Payer: Self-pay | Admitting: Internal Medicine

## 2012-11-26 VITALS — BP 132/78 | HR 88 | Temp 97.6°F | Resp 12 | Ht 63.0 in | Wt 146.4 lb

## 2012-11-26 DIAGNOSIS — R0989 Other specified symptoms and signs involving the circulatory and respiratory systems: Secondary | ICD-10-CM

## 2012-11-26 DIAGNOSIS — R5381 Other malaise: Secondary | ICD-10-CM

## 2012-11-26 DIAGNOSIS — R5383 Other fatigue: Secondary | ICD-10-CM

## 2012-11-26 DIAGNOSIS — K219 Gastro-esophageal reflux disease without esophagitis: Secondary | ICD-10-CM

## 2012-11-26 DIAGNOSIS — R0609 Other forms of dyspnea: Secondary | ICD-10-CM

## 2012-11-26 DIAGNOSIS — R1310 Dysphagia, unspecified: Secondary | ICD-10-CM

## 2012-11-26 DIAGNOSIS — IMO0001 Reserved for inherently not codable concepts without codable children: Secondary | ICD-10-CM

## 2012-11-26 LAB — CK: Total CK: 215 U/L — ABNORMAL HIGH (ref 7–177)

## 2012-11-26 NOTE — Patient Instructions (Addendum)
1. Pulmonary/allergy - tested by Dr. Maple Hudson - negative for all allergens. Pulmonary function studies will be helpful. I also agree that you may come to a sleep study if all other testing is normal.  2. Swallow - the swallowing study did reveal that there is a likely stricture at the Lower esophagus that interferes with swallowing. Plan Will refer back to Dr. Marina Goodell - you may need upper endoscopy and dilation of the stricture.  3. Peripheral neuropathy - the tingling in your foot. We have previously checked B12, Thyroid function and MRI brain. This may be an idiopathic peripheral neuropathy that is not really causing much trouble.  4. Generalized fatigue, malaise and muscle weakness - the concern, with a high sed rate of 76 (nl 0-20), is for an immunologic disease affecting muscles and endurance. Plan Initial lab work: ANA, Rheumatoid factor, CK - an enzyme related to muscle inflammation, CCP - a test that also looks for rheumatoid disease, and a vitamin D level. Further recommendations will be based on these results.

## 2012-11-26 NOTE — Progress Notes (Signed)
Subjective:    Patient ID: [name], female    DOB: May 09, 1930, 79 y.o.   MRN: [id number]  HPI [name] presents for continue malaise and fatigue along with SOB. She has been seen twice by Dr. Maple Hudson - allergy testing was negative; a trial of lisinopril did not make any difference in her condition. She denies fever, chills, productive cough, N/V/D, focal pain, chest pressure, palpitations. Se has had recent lab with a ESR 76, nl CBC, nl TSH in January '14. Her symptoms do limit her activities.  Past Medical History  Diagnosis Date  . BREAST CANCER, HX OF 03/16/2007  . PEPTIC ULCER DISEASE 03/16/2007  . IRRITABLE BOWEL SYNDROME, HX OF 03/16/2007  . DEGENERATIVE JOINT DISEASE, CERVICAL SPINE 03/16/2007  . HYPERLIPIDEMIA 05/18/2007  . ANXIETY DEPRESSION 08/25/2009  . Essential hypertension, benign 10/28/2008  . CORONARY ATHEROSCLEROSIS NATIVE CORONARY ARTERY 10/28/2008  . CAROTID ARTERY STENOSIS 10/28/2008  . Chronic rhinitis 09/17/2008  . GERD 10/10/2007  . Diverticulitis of colon (without mention of hemorrhage) 03/16/2007  . CAD (coronary artery disease) 2005    s/p DES to LCX  . Cystitis   . Palpitations    Past Surgical History  Procedure Laterality Date  . Reconstruction breast w/ tram flap      right  . Hernia repair  2002    ventral hernia  . Finger arthroplasty  2002    Right thumb  . Abdominal hysterectomy    . Cholecystectomy    . Mastectomy    . Oophorectomy    . Varicose vein surgery     Family History  Problem Relation Age of Onset  . Cancer Father     Lung Cancer  . Asthma Other   . Cancer Other     breast  . COPD    . Heart disease Mother   . Heart disease Brother    History   Social History  . Marital Status: Widowed    Spouse Name: N/A    Number of Children: 5  . Years of Education: N/A   Occupational History  . Retired    Social History Main Topics  . Smoking status: Never Smoker   . Smokeless tobacco: Never Used  . Alcohol Use: No  . Drug  Use: No  . Sexually Active: Not on file   Other Topics Concern  . Not on file   Social History Narrative   Has several children 3 dtrs, 2 sons, and several grandchildren in [place] and [address] She sees them often.   Lives independently. Is very active    Current Outpatient Prescriptions on File Prior to Visit  Medication Sig Dispense Refill  . albuterol (PROVENTIL,VENTOLIN) 90 MCG/ACT inhaler Inhale 2 puffs into the lungs every 4 (four) hours as needed. Shortness of  breath      . alendronate (FOSAMAX) 70 MG tablet Take 70 mg by mouth every 7 (seven) days. Take with a full glass of water on an empty stomach. Fridays      . ascorbic acid (VITAMIN C) 1000 MG tablet Take 1,000 mg by mouth daily.      Marland Kitchen aspirin 81 MG tablet Take 81 mg by mouth daily.       . beclomethasone (QVAR) 40 MCG/ACT inhaler Inhale 2 puffs into the lungs 2 (two) times daily as needed. For shortness of breath      . benzonatate (TESSALON) 100 MG capsule Take 1 capsule (100 mg total) by mouth 3 (three) times daily as needed  for cough.  30 capsule  1  . cetirizine (ZYRTEC) 10 MG tablet Take 10 mg by mouth daily as needed. allergies      . Multiple Vitamin (MULTIVITAMIN) capsule Take 1 capsule by mouth daily.        Marland Kitchen NEXIUM 40 MG capsule TAKE (1) CAPSULE DAILY.  90 capsule  1  . nitroGLYCERIN (NITROSTAT) 0.4 MG SL tablet Place 0.4 mg under the tongue every 5 (five) minutes as needed. Chest pains.      . ranitidine (ZANTAC) 150 MG tablet Take 150 mg by mouth at bedtime.        No current facility-administered medications on file prior to visit.      Review of Systems System review is negative for any constitutional, cardiac, pulmonary, GI or neuro symptoms or complaints other than as described in the HPI.     Objective:   Physical Exam Filed Vitals:   11/26/12 1142  BP: 132/78  Pulse: 88  Temp: 97.6 F (36.4 C)  Resp: 12   Wt Readings from Last 3 Encounters:  11/26/12 146 lb 6.4 oz (66.407 kg)   11/08/12 150 lb 12.8 oz (68.402 kg)  10/02/12 149 lb (67.586 kg)   Gen'l- WNWD  Mildly overweight white woman in no acute distress HEENT- C&S clear, PERRLA, EACsTMs normal, tender to palpation over TMJ bilaterally with mild crepitus, oropharynx w/o lesions Neck- supple, no thyromegaly Nodes - negative cerivcal, supraclavicular region Cor- 2+ radial pulse, RRR Pulm - normal respirations, no rales or wheezes Neuro - A&O x 3, CN II-XII grossly normal, normal gait and station.       Assessment & Plan:

## 2012-11-27 LAB — CYCLIC CITRUL PEPTIDE ANTIBODY, IGG: Cyclic Citrullin Peptide Ab: <2 U/mL (ref 0.0–5.0)

## 2012-11-27 NOTE — Assessment & Plan Note (Signed)
Solid food dysphagia that has gotten worse. Last EGD in Jan 2013.  Plan  refer to Dr. Marina Goodell - may need repeat EGD and dilation

## 2012-11-27 NOTE — Assessment & Plan Note (Signed)
Generalized fatigue, malaise and muscle weakness - the concern, with a high sed rate of 76 (nl 0-20), is for an immunologic disease affecting muscles and endurance.  Plan Initial lab work: ANA, Rheumatoid factor, CK - an enzyme related to muscle inflammation, CCP - a test that also looks for rheumatoid disease, and a vitamin D level. Further recommendations will be based on these results.

## 2012-11-27 NOTE — Assessment & Plan Note (Signed)
Pulmonary/allergy - tested by Dr. Maple Hudson - negative for all allergens.   Plan  Pulmonary function studies will be helpful.   may come to a sleep study if all other testing is normal.

## 2012-11-28 ENCOUNTER — Encounter: Payer: Self-pay | Admitting: Internal Medicine

## 2012-11-28 ENCOUNTER — Ambulatory Visit (INDEPENDENT_AMBULATORY_CARE_PROVIDER_SITE_OTHER): Payer: Medicare Other | Admitting: Internal Medicine

## 2012-11-28 VITALS — BP 110/70 | HR 96 | Ht 62.0 in | Wt 148.4 lb

## 2012-11-28 DIAGNOSIS — K219 Gastro-esophageal reflux disease without esophagitis: Secondary | ICD-10-CM

## 2012-11-28 DIAGNOSIS — K222 Esophageal obstruction: Secondary | ICD-10-CM

## 2012-11-28 DIAGNOSIS — R1310 Dysphagia, unspecified: Secondary | ICD-10-CM

## 2012-11-28 NOTE — Progress Notes (Signed)
HISTORY OF PRESENT ILLNESS:  [name] is a 79 y.o. female with multiple medical problems as listed below. I have seen her previously for recurrent diverticulitis and colon cancer screening. As well, problems with epigastric pain. She was last seen in January 2013 when she underwent colonoscopy and upper endoscopy. Colonoscopy revealed moderate sigmoid diverticulosis and a subcentimeter sigmoid colon polyp which was removed. Upper endoscopy revealed an incidental esophageal ringlike stricture but was otherwise normal. She is sent today by Dr. Debby Bud regarding dysphagia and the need for endoscopy/esophageal dilation. The patient herself reports a very tangential history with great concerns over problems with fatigue, syncope, coughing spells, and other non-GI complaints. She believes she is here to have those issues addressed. I redirected The conversation. In terms of swallowing, she does mention pill dysphagia and occasional solid food dysphagia. She thinks that this has been going on for 6 months. This has occasional nausea. GI review of systems is otherwise negative. For reflux disease she is on Nexium 40 mg daily and ranitidine 150 mg at night. She does take Fosamax. Review of blood work from January was unremarkable. Throughout the interview the patient seems to force herself to cough repeatedly and somewhat violently.  REVIEW OF SYSTEMS:  All non-GI ROS negative except for allergies, visual change, cough, fatigue, shortness of breath, hearing problems, sore throat, excessive urination  Past Medical History  Diagnosis Date  . BREAST CANCER, HX OF 03/16/2007  . PEPTIC ULCER DISEASE 03/16/2007  . IRRITABLE BOWEL SYNDROME, HX OF 03/16/2007  . DEGENERATIVE JOINT DISEASE, CERVICAL SPINE 03/16/2007  . HYPERLIPIDEMIA 05/18/2007  . ANXIETY DEPRESSION 08/25/2009  . Essential hypertension, benign 10/28/2008  . CORONARY ATHEROSCLEROSIS NATIVE CORONARY ARTERY 10/28/2008  . CAROTID ARTERY STENOSIS 10/28/2008   . Chronic rhinitis 09/17/2008  . GERD 10/10/2007  . Diverticulitis of colon (without mention of hemorrhage) 03/16/2007  . CAD (coronary artery disease) 2005    s/p DES to LCX  . Cystitis   . Palpitations   . Colon polyps   . Hiatal hernia   . Esophageal stricture     Past Surgical History  Procedure Laterality Date  . Reconstruction breast w/ tram flap      right  . Hernia repair  2002    ventral hernia  . Finger arthroplasty  2002    Right thumb  . Abdominal hysterectomy    . Cholecystectomy    . Mastectomy    . Oophorectomy    . Varicose vein surgery      Social History [name]  reports that she has never smoked. She has never used smokeless tobacco. She reports that she does not drink alcohol or use illicit drugs.  family history includes Asthma in her other; COPD in an unspecified family member; Cancer in her father and other; and Heart disease in her brother and mother.  Allergies  Allergen Reactions  . Flagyl (Metronidazole Hcl) Diarrhea  . Amoxicillin-Pot Clavulanate Diarrhea  . Codeine Nausea And Vomiting    Over-sedating  . Diazepam     Hallucination   . Meperidine Hcl Nausea Only  . Sulfamethoxazole W-Trimethoprim Diarrhea  . Valium     Elevated blood pressure       PHYSICAL EXAMINATION: Vital signs: BP 110/70  Pulse 96  Ht 5\' 2"  (1.575 m)  Wt 148 lb 6 oz (67.302 kg)  BMI 27.13 kg/m2 General: Well-developed, well-nourished, no acute distress HEENT: Sclerae are anicteric, conjunctiva pink. Oral mucosa intact Lungs: Clear Heart: Regular Abdomen: soft, nontender,  nondistended, no obvious ascites, no peritoneal signs, normal bowel sounds. No organomegaly. Extremities: No edema Psychiatric: alert and oriented x3. Cooperative     ASSESSMENT:  #1. Intermittent solid food and pill dysphagia. Secondary to known distal esophageal stricture #2. GERD. No active reflux symptoms on medical therapy #3. History of diverticulitis. #4. Multiple  mild GI complaints  PLAN:  #1. Reflux precautions #2. Continue PPI #3. Schedule upper endoscopy with esophageal dilation.The nature of the procedure, as well as the risks, benefits, and alternatives were carefully and thoroughly reviewed with the patient. Ample time for discussion and questions allowed. The patient understood, was satisfied, and agreed to proceed. #4. Return to Dr. Debby Bud regarding non-GI complaints

## 2012-11-28 NOTE — Patient Instructions (Addendum)
You have been scheduled for an endoscopy with dilation with propofol. Please follow written instructions given to you at your visit today. If you use inhalers (even only as needed), please bring them with you on the day of your procedure.

## 2012-11-30 ENCOUNTER — Encounter: Payer: Self-pay | Admitting: Internal Medicine

## 2012-11-30 ENCOUNTER — Other Ambulatory Visit: Payer: Self-pay

## 2012-11-30 ENCOUNTER — Ambulatory Visit (INDEPENDENT_AMBULATORY_CARE_PROVIDER_SITE_OTHER): Payer: Medicare Other | Admitting: Internal Medicine

## 2012-11-30 VITALS — BP 128/80 | HR 98 | Temp 97.8°F | Ht 62.0 in | Wt 150.5 lb

## 2012-11-30 DIAGNOSIS — Z9011 Acquired absence of right breast and nipple: Secondary | ICD-10-CM

## 2012-11-30 DIAGNOSIS — R21 Rash and other nonspecific skin eruption: Secondary | ICD-10-CM

## 2012-11-30 DIAGNOSIS — Z853 Personal history of malignant neoplasm of breast: Secondary | ICD-10-CM

## 2012-11-30 DIAGNOSIS — Z1231 Encounter for screening mammogram for malignant neoplasm of breast: Secondary | ICD-10-CM

## 2012-11-30 NOTE — Progress Notes (Signed)
Subjective:    Patient ID: [name], female    DOB: April 02, 1930, 79 y.o.   MRN: [id number]  Rash    Pt presents to the clinic today with c/o rash on her left breast. She has been seen by Dr. Debby Bud for the same and was given triamcinolone cream for the area. She puts that on it and the rash will go down but as soon as she stops it, it comes right back. It does itch. It has not spread. It has been there for 2-3 months.   Review of Systems  Skin: Positive for rash.        Past Medical History  Diagnosis Date  . BREAST CANCER, HX OF 03/16/2007  . PEPTIC ULCER DISEASE 03/16/2007  . IRRITABLE BOWEL SYNDROME, HX OF 03/16/2007  . DEGENERATIVE JOINT DISEASE, CERVICAL SPINE 03/16/2007  . HYPERLIPIDEMIA 05/18/2007  . ANXIETY DEPRESSION 08/25/2009  . Essential hypertension, benign 10/28/2008  . CORONARY ATHEROSCLEROSIS NATIVE CORONARY ARTERY 10/28/2008  . CAROTID ARTERY STENOSIS 10/28/2008  . Chronic rhinitis 09/17/2008  . GERD 10/10/2007  . Diverticulitis of colon (without mention of hemorrhage) 03/16/2007  . CAD (coronary artery disease) 2005    s/p DES to LCX  . Cystitis   . Palpitations   . Colon polyps   . Hiatal hernia   . Esophageal stricture     Current Outpatient Prescriptions  Medication Sig Dispense Refill  . albuterol (PROVENTIL,VENTOLIN) 90 MCG/ACT inhaler Inhale 2 puffs into the lungs every 4 (four) hours as needed. Shortness of  breath      . alendronate (FOSAMAX) 70 MG tablet Take 70 mg by mouth every 7 (seven) days. Take with a full glass of water on an empty stomach. Fridays      . ascorbic acid (VITAMIN C) 1000 MG tablet Take 1,000 mg by mouth daily.      Marland Kitchen aspirin 81 MG tablet Take 81 mg by mouth daily.       . beclomethasone (QVAR) 40 MCG/ACT inhaler Inhale 2 puffs into the lungs 2 (two) times daily as needed. For shortness of breath      . benzonatate (TESSALON) 100 MG capsule Take 1 capsule (100 mg total) by mouth 3 (three) times daily as needed for cough.  30  capsule  1  . cetirizine (ZYRTEC) 10 MG tablet Take 10 mg by mouth daily as needed. allergies      . Multiple Vitamin (MULTIVITAMIN) capsule Take 1 capsule by mouth daily.        Marland Kitchen NEXIUM 40 MG capsule TAKE (1) CAPSULE DAILY.  90 capsule  1  . nitroGLYCERIN (NITROSTAT) 0.4 MG SL tablet Place 0.4 mg under the tongue every 5 (five) minutes as needed. Chest pains.      . ranitidine (ZANTAC) 150 MG tablet Take 150 mg by mouth at bedtime.        No current facility-administered medications for this visit.    Allergies  Allergen Reactions  . Flagyl (Metronidazole Hcl) Diarrhea  . Amoxicillin-Pot Clavulanate Diarrhea  . Codeine Nausea And Vomiting    Over-sedating  . Diazepam     Hallucination   . Meperidine Hcl Nausea Only  . Sulfamethoxazole W-Trimethoprim Diarrhea  . Valium     Elevated blood pressure    Family History  Problem Relation Age of Onset  . Cancer Father     Lung Cancer  . Asthma Other   . Cancer Other     breast  . COPD    . Heart  disease Mother   . Heart disease Brother     History   Social History  . Marital Status: Widowed    Spouse Name: N/A    Number of Children: 5  . Years of Education: N/A   Occupational History  . Retired    Social History Main Topics  . Smoking status: Never Smoker   . Smokeless tobacco: Never Used  . Alcohol Use: No  . Drug Use: No  . Sexually Active: Not on file   Other Topics Concern  . Not on file   Social History Narrative   Has several children 3 dtrs, 2 sons, and several grandchildren in [place] and [address] She sees them often.   Lives independently. Is very active     Constitutional: Denies fever, malaise, fatigue, headache or abrupt weight changes. .  Skin: Pt reports rash on left breast. Denies redness, lesions or ulcercations.     No other specific complaints in a complete review of systems (except as listed in HPI above).  Objective:   Physical Exam   BP 128/80  Pulse 98  Temp(Src) 97.8 F  (36.6 C) (Oral)  Ht 5\' 2"  (1.575 m)  Wt 150 lb 8 oz (68.266 kg)  BMI 27.52 kg/m2  SpO2 98% Wt Readings from Last 3 Encounters:  11/30/12 150 lb 8 oz (68.266 kg)  11/28/12 148 lb 6 oz (67.302 kg)  11/26/12 146 lb 6.4 oz (66.407 kg)    General: Appears their stated age, well developed, well nourished in NAD. Skin: Warm, dry and intact. Rash note on left breast just above the nipple. Cardiovascular: Normal rate and rhythm. S1,S2 noted.  No murmur, rubs or gallops noted. No JVD or BLE edema. No carotid bruits noted. Pulmonary/Chest: Normal effort and positive vesicular breath sounds. No respiratory distress. No wheezes, rales or ronchi noted.        Assessment & Plan:  Rash of left breast, question fungal versus other skin lesion:  Will continue to try triamcinolone cream Come back for shave biopsy after you have your mammogram done Will send letter t [facility] giving ok to have mammogram done  RTC as needed

## 2012-11-30 NOTE — Progress Notes (Deleted)
Subjective:    Patient ID: [name], female    DOB: 01-31-1930, 79 y.o.   MRN: [id number]  HPI  Pt presents to the clinic today with c/o rash.   Review of Systems      Past Medical History  Diagnosis Date  . BREAST CANCER, HX OF 03/16/2007  . PEPTIC ULCER DISEASE 03/16/2007  . IRRITABLE BOWEL SYNDROME, HX OF 03/16/2007  . DEGENERATIVE JOINT DISEASE, CERVICAL SPINE 03/16/2007  . HYPERLIPIDEMIA 05/18/2007  . ANXIETY DEPRESSION 08/25/2009  . Essential hypertension, benign 10/28/2008  . CORONARY ATHEROSCLEROSIS NATIVE CORONARY ARTERY 10/28/2008  . CAROTID ARTERY STENOSIS 10/28/2008  . Chronic rhinitis 09/17/2008  . GERD 10/10/2007  . Diverticulitis of colon (without mention of hemorrhage) 03/16/2007  . CAD (coronary artery disease) 2005    s/p DES to LCX  . Cystitis   . Palpitations   . Colon polyps   . Hiatal hernia   . Esophageal stricture     Current Outpatient Prescriptions  Medication Sig Dispense Refill  . albuterol (PROVENTIL,VENTOLIN) 90 MCG/ACT inhaler Inhale 2 puffs into the lungs every 4 (four) hours as needed. Shortness of  breath      . alendronate (FOSAMAX) 70 MG tablet Take 70 mg by mouth every 7 (seven) days. Take with a full glass of water on an empty stomach. Fridays      . ascorbic acid (VITAMIN C) 1000 MG tablet Take 1,000 mg by mouth daily.      Marland Kitchen aspirin 81 MG tablet Take 81 mg by mouth daily.       . beclomethasone (QVAR) 40 MCG/ACT inhaler Inhale 2 puffs into the lungs 2 (two) times daily as needed. For shortness of breath      . benzonatate (TESSALON) 100 MG capsule Take 1 capsule (100 mg total) by mouth 3 (three) times daily as needed for cough.  30 capsule  1  . cetirizine (ZYRTEC) 10 MG tablet Take 10 mg by mouth daily as needed. allergies      . Multiple Vitamin (MULTIVITAMIN) capsule Take 1 capsule by mouth daily.        Marland Kitchen NEXIUM 40 MG capsule TAKE (1) CAPSULE DAILY.  90 capsule  1  . nitroGLYCERIN (NITROSTAT) 0.4 MG SL tablet Place 0.4 mg under the  tongue every 5 (five) minutes as needed. Chest pains.      . ranitidine (ZANTAC) 150 MG tablet Take 150 mg by mouth at bedtime.        No current facility-administered medications for this visit.    Allergies  Allergen Reactions  . Flagyl (Metronidazole Hcl) Diarrhea  . Amoxicillin-Pot Clavulanate Diarrhea  . Codeine Nausea And Vomiting    Over-sedating  . Diazepam     Hallucination   . Meperidine Hcl Nausea Only  . Sulfamethoxazole W-Trimethoprim Diarrhea  . Valium     Elevated blood pressure    Family History  Problem Relation Age of Onset  . Cancer Father     Lung Cancer  . Asthma Other   . Cancer Other     breast  . COPD    . Heart disease Mother   . Heart disease Brother     History   Social History  . Marital Status: Widowed    Spouse Name: N/A    Number of Children: 5  . Years of Education: N/A   Occupational History  . Retired    Social History Main Topics  . Smoking status: Never Smoker   . Smokeless tobacco: Never  Used  . Alcohol Use: No  . Drug Use: No  . Sexually Active: Not on file   Other Topics Concern  . Not on file   Social History Narrative   Has several children 3 dtrs, 2 sons, and several grandchildren in [place] and [address] She sees them often.   Lives independently. Is very active     Constitutional: Denies fever, malaise, fatigue, headache or abrupt weight changes.  HEENT: Denies eye pain, eye redness, ear pain, ringing in the ears, wax buildup, runny nose, nasal congestion, bloody nose, or sore throat. Respiratory: Denies difficulty breathing, shortness of breath, cough or sputum production.   Cardiovascular: Denies chest pain, chest tightness, palpitations or swelling in the hands or feet.  Gastrointestinal: Denies abdominal pain, bloating, constipation, diarrhea or blood in the stool.  GU: Denies urgency, frequency, pain with urination, burning sensation, blood in urine, odor or discharge. Musculoskeletal: Denies decrease  in range of motion, difficulty with gait, muscle pain or joint pain and swelling.  Skin: Denies redness, rashes, lesions or ulcercations.  Neurological: Denies dizziness, difficulty with memory, difficulty with speech or problems with balance and coordination.   No other specific complaints in a complete review of systems (except as listed in HPI above).  Objective:   Physical Exam        Assessment & Plan:

## 2012-11-30 NOTE — Patient Instructions (Signed)
Rash  A rash is a change in the color or texture of your skin. There are many different types of rashes. You may have other problems that accompany your rash.  CAUSES     Infections.   Allergic reactions. This can include allergies to pets or foods.   Certain medicines.   Exposure to certain chemicals, soaps, or cosmetics.   Heat.   Exposure to poisonous plants.   Tumors, both cancerous and noncancerous.  SYMPTOMS     Redness.   Scaly skin.   Itchy skin.   Dry or cracked skin.   Bumps.   Blisters.   Pain.  DIAGNOSIS   Your caregiver may do a physical exam to determine what type of rash you have. A skin sample (biopsy) may be taken and examined under a microscope.  TREATMENT    Treatment depends on the type of rash you have. Your caregiver may prescribe certain medicines. For serious conditions, you may need to see a skin doctor (dermatologist).  HOME CARE INSTRUCTIONS     Avoid the substance that caused your rash.   Do not scratch your rash. This can cause infection.   You may take cool baths to help stop itching.   Only take over-the-counter or prescription medicines as directed by your caregiver.   Keep all follow-up appointments as directed by your caregiver.  SEEK IMMEDIATE MEDICAL CARE IF:   You have increasing pain, swelling, or redness.   You have a fever.   You have new or severe symptoms.   You have body aches, diarrhea, or vomiting.   Your rash is not better after 3 days.  MAKE SURE YOU:   Understand these instructions.   Will watch your condition.   Will get help right away if you are not doing well or get worse.  Document Released: 07/29/2002 Document Revised: 10/31/2011 Document Reviewed: 05/23/2011  ExitCare Patient Information 2013 [employer].

## 2012-12-03 ENCOUNTER — Ambulatory Visit
Admission: RE | Admit: 2012-12-03 | Discharge: 2012-12-03 | Disposition: A | Discharge status: Home / Self Care | Payer: Medicare Other | Source: Ambulatory Visit

## 2012-12-03 DIAGNOSIS — Z1231 Encounter for screening mammogram for malignant neoplasm of breast: Secondary | ICD-10-CM

## 2012-12-03 DIAGNOSIS — Z9011 Acquired absence of right breast and nipple: Secondary | ICD-10-CM

## 2012-12-03 DIAGNOSIS — Z853 Personal history of malignant neoplasm of breast: Secondary | ICD-10-CM

## 2012-12-04 ENCOUNTER — Encounter: Payer: Self-pay | Admitting: Internal Medicine

## 2012-12-04 ENCOUNTER — Ambulatory Visit (AMBULATORY_SURGERY_CENTER): Payer: Medicare Other | Admitting: Internal Medicine

## 2012-12-04 VITALS — BP 149/88 | HR 87 | Temp 97.7°F | Resp 18 | Ht 62.0 in | Wt 148.0 lb

## 2012-12-04 DIAGNOSIS — K219 Gastro-esophageal reflux disease without esophagitis: Secondary | ICD-10-CM

## 2012-12-04 DIAGNOSIS — R1310 Dysphagia, unspecified: Secondary | ICD-10-CM

## 2012-12-04 DIAGNOSIS — K222 Esophageal obstruction: Secondary | ICD-10-CM

## 2012-12-04 MED ORDER — SODIUM CHLORIDE 0.9 % IV SOLN: 500.0000 mL | INTRAVENOUS | Status: DC

## 2012-12-04 NOTE — Op Note (Signed)
[facility] [address] [place], [address]   ENDOSCOPY PROCEDURE REPORT  PATIENT: [name], [name]  MR#: [id number] BIRTHDATE: 04/10/1930 , 83  yrs. old GENDER: Female ENDOSCOPIST: Roxy Cedar, MD REFERRED BY:  Jacques Navy, M.D. PROCEDURE DATE:  12/04/2012 PROCEDURE:  EGD, diagnostic and Maloney dilation of esophagus - 23F  ASA CLASS:     Class II INDICATIONS:  Dysphagia, known esophageal stricture.   Therapeutic procedure-esophageal dilation. MEDICATIONS: MAC sedation, administered by CRNA and propofol (Diprivan) 100mg  IV TOPICAL ANESTHETIC: none  DESCRIPTION OF PROCEDURE: After the risks benefits and alternatives of the procedure were thoroughly explained, informed consent was obtained.  The [facility] GIF-H180 [id number] endoscope was introduced through the mouth and advanced to the second portion of the duodenum. Without limitations.  The instrument was slowly withdrawn as the mucosa was fully examined.      The esophagus revealed a focal ringlike stricture at the gastroesophageal junction measuring approximately 14 mm.  No associated inflammation.  The stomach was normal.  Retroflexed view revealed a sliding hiatal hernia.  The duodenum was normal to the second portion.  THERAPY: 12 French Maloney dilator was passed into the esophagus without resistance or heme.  Tolerated well.         The scope was then withdrawn from the patient and the procedure completed.  COMPLICATIONS: There were no complications. ENDOSCOPIC IMPRESSION: 1. Focal benign distal esophageal stricture status post dilation 2. GERD.  RECOMMENDATIONS: 1.  Clear liquids until 5:30 PM, then soft foods rest of day. Resume prior diet tomorrow. 2.  Continue PPI (Nexium) daily  REPEAT EXAM: eSigned:  Roxy Cedar, MD 12/04/2012 3:44 PM  XB:[id number] Esther Hardy, MD and The Patient

## 2012-12-04 NOTE — Progress Notes (Signed)
Called into the room after the pt's esophagus was dilated with a 52 maloney no heme was reported by Brennan Bailey, CRNA. Maw

## 2012-12-04 NOTE — Progress Notes (Signed)
Patient did not experience any of the following events: a burn prior to discharge; a fall within the facility; wrong site/side/patient/procedure/implant event; or a hospital transfer or hospital admission upon discharge from the facility. (G8907) Patient did not have preoperative order for IV antibiotic SSI prophylaxis. (G8918)  

## 2012-12-04 NOTE — Patient Instructions (Addendum)
Continue Nexium daily.  YOU HAD AN ENDOSCOPIC PROCEDURE TODAY AT THE [facility]: Refer to the procedure report that was given to you for any specific questions about what was found during the examination.  If the procedure report does not answer your questions, please call your gastroenterologist to clarify.  If you requested that your care partner not be given the details of your procedure findings, then the procedure report has been included in a sealed envelope for you to review at your convenience later.  YOU SHOULD EXPECT: Some feelings of bloating in the abdomen. Passage of more gas than usual.  Walking can help get rid of the air that was put into your GI tract during the procedure and reduce the bloating. If you had a lower endoscopy (such as a colonoscopy or flexible sigmoidoscopy) you may notice spotting of blood in your stool or on the toilet paper. If you underwent a bowel prep for your procedure, then you may not have a normal bowel movement for a few days.  DIET: Clear liquids until 5:30pm, then soft foods the rest of today. ACTIVITY: Your care partner should take you home directly after the procedure.  You should plan to take it easy, moving slowly for the rest of the day.  You can resume normal activity the day after the procedure however you should NOT DRIVE or use heavy machinery for 24 hours (because of the sedation medicines used during the test).    SYMPTOMS TO REPORT IMMEDIATELY: A gastroenterologist can be reached at any hour.  During normal business hours, 8:30 AM to 5:00 PM Monday through Friday, call [phone].  After hours and on weekends, please call the GI answering service at [phone] who will take a message and have the physician on call contact you.   Following lower endoscopy (colonoscopy or flexible sigmoidoscopy):  Excessive amounts of blood in the stool  Significant tenderness or worsening of abdominal pains  Swelling of the abdomen that is  new, acute  Fever of 100F or higher  Following upper endoscopy (EGD)  Vomiting of blood or coffee ground material  New chest pain or pain under the shoulder blades  Painful or persistently difficult swallowing  New shortness of breath  Fever of 100F or higher  Black, tarry-looking stools  FOLLOW UP: If any biopsies were taken you will be contacted by phone or by letter within the next 1-3 weeks.  Call your gastroenterologist if you have not heard about the biopsies in 3 weeks.  Our staff will call the home number listed on your records the next business day following your procedure to check on you and address any questions or concerns that you may have at that time regarding the information given to you following your procedure. This is a courtesy call and so if there is no answer at the home number and we have not heard from you through the emergency physician on call, we will assume that you have returned to your regular daily activities without incident.  SIGNATURES/CONFIDENTIALITY: You and/or your care partner have signed paperwork which will be entered into your electronic medical record.  These signatures attest to the fact that that the information above on your After Visit Summary has been reviewed and is understood.  Full responsibility of the confidentiality of this discharge information lies with you and/or your care-partner.

## 2012-12-04 NOTE — Progress Notes (Signed)
Propofol given over incremental dosages 

## 2012-12-05 ENCOUNTER — Telehealth: Payer: Self-pay

## 2012-12-05 NOTE — Telephone Encounter (Signed)
  Follow up Call-  Call back number 12/04/2012 09/21/2011  Post procedure Call Back phone  # [phone]  Permission to leave phone message Yes Yes     Patient questions:  Do you have a fever, pain , or abdominal swelling? no Pain Score  0 *  Have you tolerated food without any problems? yes  Have you been able to return to your normal activities? yes  Do you have any questions about your discharge instructions: Diet   no Medications  no Follow up visit  no  Do you have questions or concerns about your Care? no  Actions: * If pain score is 4 or above: No action needed, pain <4.  Per the pt she has a sore throat last night and this am.  She said she expected this.  I advised her this was normal and it should improve within next couple of days.  I also told her to call us back tommorow if she is not improving, since we will be closed Friday. Maw

## 2012-12-07 ENCOUNTER — Other Ambulatory Visit: Payer: Self-pay | Admitting: Internal Medicine

## 2012-12-21 ENCOUNTER — Ambulatory Visit (INDEPENDENT_AMBULATORY_CARE_PROVIDER_SITE_OTHER): Payer: Medicare Other | Admitting: Internal Medicine

## 2012-12-21 ENCOUNTER — Encounter: Payer: Self-pay | Admitting: Internal Medicine

## 2012-12-21 VITALS — BP 122/88 | HR 104 | Ht 62.0 in | Wt 148.0 lb

## 2012-12-21 DIAGNOSIS — R059 Cough, unspecified: Secondary | ICD-10-CM

## 2012-12-21 DIAGNOSIS — R05 Cough: Secondary | ICD-10-CM

## 2012-12-21 MED ORDER — BECLOMETHASONE DIPROPIONATE 80 MCG/ACT IN AERS: 2.0000 | INHALATION_SPRAY | Freq: Two times a day (BID) | RESPIRATORY_TRACT | Status: DC

## 2012-12-21 NOTE — Progress Notes (Signed)
PFT done today. 

## 2012-12-21 NOTE — Progress Notes (Signed)
10/01/12- 82 yoF never smoker seen in allergy consultation at kind request of Dr Debby Bud. Complains of feeling tired all the time wants to stay home in sleep. Visiting [place], had asthma/bronchitis each time she visited son's family in [place],. Lives at [facility]-can't walk halls as before because she feels tired. No day-to-day variation. Naps do help some. Falls asleep watching TV. Sleeps well. Bedtime 10:30 or 11 PM, up at 8 AM.no snoring. Dry cough. Benzonatate helps some.  11/08/12- 82 yoF never smoker seen in allergy consultation at kind request of Dr Debby Bud. Follows for: pt here to go over allergy test.pt also complaing of sob,wheezing,due to allergies. denies any cough.  Dr Debby Bud office asks we do her TDAP and pneumovax while here. Allergy profile- 10/02/12- Total IgE 9.6, negative for all agents tested. Tried off lisinopril for one month with no effect on dry cough so she restarted. Has pill dysphagia and may cough until she retches. Still expects to get asthma within 3 days when visiting family with grandchildren and pets in [place].  12/21/12-  83 yoF never smoker followed for allergy, asthma/ bronchitis/ cough complicated by CAD, Dysphagia, hx Breast Ca.    PCP Dr Debby Bud. FOLLOWS FOR: states cough is the same since last OV, worsening d/t pollen MBS swallowing eval- 11/16/12- Probable LPR, esophageal dysphagia, esophageal dilatation. CXR 06/05/12 Findings: The cardiac silhouette, mediastinal and hilar contours  are normal and stable. The lungs are clear. Densely calcified  mediastinal lymph nodes are noted along with coronary artery  stents. Surgical changes are noted involving the right chest wall.  Vertebral augmentation changes noted at L2.  IMPRESSION:  No acute cardiopulmonary findings.  Original Report Authenticated By: P. Loralie Champagne, M.D.  ROS-see HPI Constitutional:   No-   weight loss, night sweats, fevers, chills, fatigue, lassitude. HEENT:   No-  headaches,  difficulty swallowing, tooth/dental problems, +sore throat,       No-  sneezing, itching, ear ache, nasal congestion, post nasal drip,  CV:  No-   chest pain, orthopnea, PND, swelling in lower extremities, anasarca,                                  dizziness, +palpitations Resp: +  shortness of breath with exertion or at rest.              No-   productive cough,  + non-productive cough,  No- coughing up of blood.              No-   change in color of mucus.  +little wheezing.   Skin: No-   rash or lesions. GI:  +  heartburn, indigestion, abdominal pain, nausea, vomiting,  GU:  MS:  No-   joint pain or swelling.   Neuro-     nothing unusual Psych:  No- change in mood or affect. No depression or anxiety.  No memory loss.  OBJ- Physical Exam General- Alert, Oriented, Affect-appropriate, Distress- none acute. Medium build. Skin- rash-none, lesions- none, excoriation- none Lymphadenopathy- none Head- atraumatic            Eyes- Gross vision intact, PERRLA, conjunctivae and secretions clear            Ears- Hearing, canals-normal            Nose- Clear, no-Septal dev, mucus, polyps, erosion, perforation             Throat- Mallampati  II , mucosa red , drainage- none, tonsils- atrophic Neck- flexible , trachea midline, no stridor , thyroid nl, carotid no bruit Chest - symmetrical excursion , unlabored           Heart/CV- RRR , no murmur , no gallop  , no rub, nl s1 s2                           - JVD- none , edema- none, stasis changes- none, varices- none           Lung- clear to P&A, wheeze- none, no- cough , dullness-none, rub- none           Chest wall-  Abd-  Br/ Gen/ Rectal- Not done, not indicated Extrem- cyanosis- none, clubbing, none, atrophy- none, strength- nl Neuro- grossly intact to observation

## 2012-12-21 NOTE — Patient Instructions (Addendum)
Sample Qvar 80   2 puffs and rinse mouth, once every day      See if this has made any difference in shortness of breath or cough by the time you use up the sample. Then go back to Qvar 40, using it the same way.   Swallowing precautions as discussed.

## 2013-01-02 NOTE — Assessment & Plan Note (Signed)
Discussed chin tuck and reflux precautions Has been on Qvar 40- will try increase to Qvar 80.

## 2013-01-04 ENCOUNTER — Other Ambulatory Visit: Payer: Self-pay

## 2013-01-04 MED ORDER — NITROGLYCERIN 0.4 MG SL SUBL: 0.4000 mg | SUBLINGUAL_TABLET | SUBLINGUAL | Status: AC | PRN

## 2013-03-01 ENCOUNTER — Other Ambulatory Visit: Payer: Self-pay | Admitting: Internal Medicine

## 2013-03-01 ENCOUNTER — Telehealth: Payer: Self-pay

## 2013-03-01 DIAGNOSIS — R079 Chest pain, unspecified: Secondary | ICD-10-CM

## 2013-03-01 NOTE — Telephone Encounter (Signed)
Phone call from patient [phone] stating she is in [place], [place]. She got out of the hospital last night from having chest pains. She states an ekg was done showing she had a heart attack. She has since been discharged and received meds and was told there is nothing else that can be done for her. She also states she was placed on meds. She wants to know should she come back to [facility] or what should she do now. Please advise in Dr Debby Bud absence.  Thanks

## 2013-03-01 NOTE — Telephone Encounter (Signed)
Patient's daughter has been notified  

## 2013-03-01 NOTE — Telephone Encounter (Signed)
Spoke to patients daughter who states patient is needing a referral to the cardiologist. The correct story the daughter gave is patient went to the urgent care, ekg was done, patient was advised she had a heart attack. When patient was at the ER she was told it was pleurisy. She was given morphine and sent home. Daughter states er was not helpful. She is still having chest pain. They found a cardiologist there but she is needing a referral. The office # [phone] in [facility] Dr Remus Loffler. Please advise.

## 2013-03-01 NOTE — Telephone Encounter (Signed)
Will put the referral in per Dr. Trish Mage. PCC's can send them any information they may need

## 2013-03-01 NOTE — Telephone Encounter (Signed)
If she is in an area close to a hospital, ok to stay as she is and follow instructions given to her at DC -  I would plan to schedule OV with MEN upon her return to [facility] to review, but no need to make return to [facility] sooner than planned

## 2013-06-17 ENCOUNTER — Telehealth: Payer: Self-pay

## 2013-06-17 NOTE — Telephone Encounter (Signed)
Phone call from [place] at [place] [facility] states the pharmacy had a [facility] audit and a physician statement is needed since last year (2013) patient has a written script for #90 of Nexium. Patient only wanted 30 at that time so that is all she received. Then a few months later requested #90. Jeanice Lim states info regarding this will be faxed over and the physician statement is needed on letter head.

## 2013-06-19 ENCOUNTER — Other Ambulatory Visit: Payer: Self-pay | Admitting: Internal Medicine

## 2013-06-19 NOTE — Telephone Encounter (Signed)
Letter has been faxed to pharmacy.

## 2013-06-24 ENCOUNTER — Telehealth: Payer: Self-pay

## 2013-06-24 NOTE — Telephone Encounter (Signed)
Holly with [facility] pharmacy called and stated the letter regarding the Nexium needed to be redone and then put on letter head

## 2013-06-27 ENCOUNTER — Other Ambulatory Visit: Payer: Self-pay

## 2013-09-20 ENCOUNTER — Other Ambulatory Visit: Payer: Self-pay | Admitting: Internal Medicine

## 2013-09-20 ENCOUNTER — Telehealth: Payer: Self-pay

## 2013-09-20 DIAGNOSIS — H9190 Unspecified hearing loss, unspecified ear: Secondary | ICD-10-CM

## 2013-09-20 NOTE — Telephone Encounter (Signed)
Phone call from patient stating Dr Valda Favia needs a referral for her to have a hearing test next week. The fax number is [phone] and phone is [phone]

## 2013-09-20 NOTE — Telephone Encounter (Signed)
[facility] for referral. [name] - please be sure [name] sees this message. thanks

## 2013-11-11 ENCOUNTER — Ambulatory Visit (INDEPENDENT_AMBULATORY_CARE_PROVIDER_SITE_OTHER): Payer: Medicare Other | Admitting: Internal Medicine

## 2013-11-11 ENCOUNTER — Encounter: Payer: Self-pay | Admitting: Internal Medicine

## 2013-11-11 VITALS — BP 150/88 | HR 105 | Temp 97.5°F | Wt 144.0 lb

## 2013-11-11 DIAGNOSIS — R279 Unspecified lack of coordination: Secondary | ICD-10-CM

## 2013-11-11 DIAGNOSIS — R270 Ataxia, unspecified: Secondary | ICD-10-CM

## 2013-11-11 DIAGNOSIS — K5792 Diverticulitis of intestine, part unspecified, without perforation or abscess without bleeding: Secondary | ICD-10-CM

## 2013-11-11 DIAGNOSIS — K5732 Diverticulitis of large intestine without perforation or abscess without bleeding: Secondary | ICD-10-CM

## 2013-11-11 NOTE — Progress Notes (Signed)
   Subjective:    Patient ID: [name], female    DOB: [birth year], 80 y.o.   MRN: [id number]  HPI She saw Dr. Henrene Pastor last March and was advised to eat whatever she wanted. In July she had a flare of IBS and diverticulitis  - sick through October.   Continuing issues of balance - she has gotten hearing aids, she is for cataract surgery OS  She was staggering at one point. She is doing better but still has a little bit of trouble. She is interested in vestibular rehab and Rx provided.   PMH, FamHx and SocHx reviewed for any changes and relevance.  Current Outpatient Prescriptions on File Prior to Visit  Medication Sig Dispense Refill  . albuterol (PROVENTIL,VENTOLIN) 90 MCG/ACT inhaler Inhale 2 puffs into the lungs every 4 (four) hours as needed. Shortness of  breath      . ascorbic acid (VITAMIN C) 1000 MG tablet Take 1,000 mg by mouth daily.      Marland Kitchen aspirin 81 MG tablet Take 81 mg by mouth daily.       . beclomethasone (QVAR) 40 MCG/ACT inhaler Inhale 2 puffs into the lungs 2 (two) times daily as needed. For shortness of breath      . cetirizine (ZYRTEC) 10 MG tablet Take 10 mg by mouth daily as needed. allergies      . Multiple Vitamin (MULTIVITAMIN) capsule Take 1 capsule by mouth daily.        Marland Kitchen NEXIUM 40 MG capsule TAKE (1) CAPSULE DAILY.  90 capsule  1  . nitroGLYCERIN (NITROSTAT) 0.4 MG SL tablet Place 1 tablet (0.4 mg total) under the tongue every 5 (five) minutes as needed. Chest pains.  25 tablet  1  . ranitidine (ZANTAC) 150 MG tablet Take 150 mg by mouth at bedtime.        No current facility-administered medications on file prior to visit.      Review of Systems System review is negative for any constitutional, cardiac, pulmonary, GI or neuro symptoms or complaints other than as described in the HPI.     Objective:   Physical Exam Filed Vitals:   11/11/13 1310  BP: 150/88  Pulse: 105  Temp: 97.5 F (36.4 C)   Wt Readings from Last 3 Encounters:  11/11/13  144 lb (65.318 kg)  12/21/12 148 lb (67.132 kg)  12/04/12 148 lb (67.132 kg)   Gen'l- WNWD nicely groomed woman looking younger than her age. HEENT_ C&S clear, PERRLA Cor 2+ radial, RRR Pul Normal respirations Neuro- non focal exam, normal get up and go, normal gait        Assessment & Plan:  Continuing medical care - Dr. Nyoka Cowden & [facility] senior care at [facility].

## 2013-11-11 NOTE — Progress Notes (Signed)
Pre visit review using our clinic review tool, if applicable. No additional management support is needed unless otherwise documented below in the visit note. 

## 2013-11-12 NOTE — Assessment & Plan Note (Signed)
Per patient she had a flare while at [facility] in July with an illness that last several weeks. No records available. She has made a good recovery at this time.

## 2013-11-12 NOTE — Assessment & Plan Note (Signed)
[name] reports continue but minor problems with balance.  Plan Written order (script) for vestibular PT at [facility].

## 2013-11-26 ENCOUNTER — Encounter: Payer: Self-pay | Admitting: Internal Medicine

## 2013-12-17 ENCOUNTER — Encounter: Payer: Self-pay | Admitting: Internal Medicine

## 2013-12-17 ENCOUNTER — Other Ambulatory Visit: Payer: Medicare Other

## 2013-12-17 ENCOUNTER — Non-Acute Institutional Stay: Payer: Medicare Other | Admitting: Internal Medicine

## 2013-12-17 VITALS — BP 118/76 | HR 84 | Temp 97.9°F | Ht 62.0 in | Wt 142.0 lb

## 2013-12-17 DIAGNOSIS — R1310 Dysphagia, unspecified: Secondary | ICD-10-CM

## 2013-12-17 DIAGNOSIS — R5381 Other malaise: Secondary | ICD-10-CM

## 2013-12-17 DIAGNOSIS — I679 Cerebrovascular disease, unspecified: Secondary | ICD-10-CM

## 2013-12-17 DIAGNOSIS — R1319 Other dysphagia: Secondary | ICD-10-CM

## 2013-12-17 DIAGNOSIS — G319 Degenerative disease of nervous system, unspecified: Secondary | ICD-10-CM

## 2013-12-17 DIAGNOSIS — R1314 Dysphagia, pharyngoesophageal phase: Secondary | ICD-10-CM

## 2013-12-17 DIAGNOSIS — I10 Essential (primary) hypertension: Secondary | ICD-10-CM

## 2013-12-17 DIAGNOSIS — K5792 Diverticulitis of intestine, part unspecified, without perforation or abscess without bleeding: Secondary | ICD-10-CM

## 2013-12-17 DIAGNOSIS — R5383 Other fatigue: Secondary | ICD-10-CM

## 2013-12-17 DIAGNOSIS — K222 Esophageal obstruction: Secondary | ICD-10-CM

## 2013-12-17 DIAGNOSIS — A059 Bacterial foodborne intoxication, unspecified: Secondary | ICD-10-CM

## 2013-12-17 DIAGNOSIS — I2510 Atherosclerotic heart disease of native coronary artery without angina pectoris: Secondary | ICD-10-CM

## 2013-12-17 DIAGNOSIS — K5732 Diverticulitis of large intestine without perforation or abscess without bleeding: Secondary | ICD-10-CM

## 2013-12-17 DIAGNOSIS — S32009A Unspecified fracture of unspecified lumbar vertebra, initial encounter for closed fracture: Secondary | ICD-10-CM
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
## 2013-12-18 LAB — CBC WITH DIFFERENTIAL
BASOS: 1 %
Basophils Absolute: 0 10*3/uL (ref 0.0–0.2)
EOS: 1 %
Eosinophils Absolute: 0 10*3/uL (ref 0.0–0.4)
HEMATOCRIT: 39.5 % (ref 34.0–46.6)
HEMOGLOBIN: 12.8 g/dL (ref 11.1–15.9)
IMMATURE GRANULOCYTES: 0 %
Immature Grans (Abs): 0 10*3/uL (ref 0.0–0.1)
LYMPHS ABS: 1.5 10*3/uL (ref 0.7–3.1)
Lymphs: 28 %
MCH: 30.1 pg (ref 26.6–33.0)
MCHC: 32.4 g/dL (ref 31.5–35.7)
MCV: 93 fL (ref 79–97)
MONOCYTES: 13 %
MONOS ABS: 0.7 10*3/uL (ref 0.1–0.9)
NEUTROS ABS: 3.2 10*3/uL (ref 1.4–7.0)
Neutrophils Relative %: 57 %
PLATELETS: 314 10*3/uL (ref 150–379)
RBC: 4.25 x10E6/uL (ref 3.77–5.28)
RDW: 13.5 % (ref 12.3–15.4)
WBC: 5.5 10*3/uL (ref 3.4–10.8)

## 2013-12-18 LAB — COMPREHENSIVE METABOLIC PANEL
A/G RATIO: 1.4 (ref 1.1–2.5)
ALBUMIN: 4.2 g/dL (ref 3.5–4.7)
ALK PHOS: 74 IU/L (ref 39–117)
ALT: 16 IU/L (ref 0–32)
AST: 15 IU/L (ref 0–40)
BUN / CREAT RATIO: 16 (ref 11–26)
BUN: 17 mg/dL (ref 8–27)
CO2: 26 mmol/L (ref 18–29)
CREATININE: 1.06 mg/dL — AB (ref 0.57–1.00)
Calcium: 9.6 mg/dL (ref 8.7–10.3)
Chloride: 101 mmol/L (ref 97–108)
GFR calc Af Amer: 57 mL/min/{1.73_m2} — ABNORMAL LOW (ref >59)
GFR, EST NON AFRICAN AMERICAN: 49 mL/min/{1.73_m2} — AB (ref >59)
GLOBULIN, TOTAL: 2.9 g/dL (ref 1.5–4.5)
Glucose: 111 mg/dL — ABNORMAL HIGH (ref 65–99)
Potassium: 5.6 mmol/L — ABNORMAL HIGH (ref 3.5–5.2)
Sodium: 141 mmol/L (ref 134–144)
Total Bilirubin: 0.3 mg/dL (ref 0.0–1.2)
Total Protein: 7.1 g/dL (ref 6.0–8.5)

## 2013-12-18 LAB — TSH: TSH: 3.97 u[IU]/mL (ref 0.450–4.500)

## 2013-12-31 ENCOUNTER — Encounter: Payer: Self-pay | Admitting: Internal Medicine

## 2014-01-14 ENCOUNTER — Encounter: Payer: Self-pay | Admitting: Internal Medicine

## 2014-09-11 ENCOUNTER — Ambulatory Visit (INDEPENDENT_AMBULATORY_CARE_PROVIDER_SITE_OTHER): Payer: Medicare Other | Admitting: Nurse Practitioner

## 2014-09-11 ENCOUNTER — Encounter: Payer: Self-pay | Admitting: Nurse Practitioner

## 2014-09-11 VITALS — BP 114/78 | HR 73 | Temp 98.2°F | Resp 10 | Ht 62.0 in | Wt 141.0 lb

## 2014-09-11 DIAGNOSIS — K529 Noninfective gastroenteritis and colitis, unspecified: Secondary | ICD-10-CM

## 2014-09-11 DIAGNOSIS — J069 Acute upper respiratory infection, unspecified: Secondary | ICD-10-CM

## 2014-09-11 NOTE — Patient Instructions (Signed)
May use tylenol three times daily to help with symptoms of sore throat May use delsym every 12 hours as needed for cough   Warm drinks and soups  Inform clinic nurse if symptoms get worse

## 2014-09-11 NOTE — Progress Notes (Signed)
Patient ID: [name], female   DOB: [birth year], 81 y.o.   MRN: [id number]    PCP: Estill Dooms, MD  Allergies  Allergen Reactions  . Flagyl [Metronidazole Hcl] Diarrhea  . Amoxicillin-Pot Clavulanate Diarrhea  . Codeine Nausea And Vomiting    Over-sedating  . Diazepam     Hallucination   . Meperidine Hcl Nausea Only  . Sulfamethoxazole-Trimethoprim Diarrhea  . Valium     Elevated blood pressure    Chief Complaint  Patient presents with  . Acute Visit    Vomiting since last Saturday (last episode- Tuesday), ear pain and neck swelling   . FYI    Lives 6 months at [facility] and 6 months in [place] ([place])     HPI: Patient is a 81 y.o. female seen in the office today due to neck hurting. [facility] a [facility] at [facility], new pt of Dr Hervey Ard.  Pt had diarrhea and vomiting from Saturday to tuesday. Currently without GI symptoms.  Now with ear pain (which is not new) that goes into neck.  Pt with hearing loss, No worsening of hearing loss, no ringing in ear. Pt already had a workup due to ear problems, MRI done and now planning on seeing neurologist in [place] 10/02/14 hearing loss and causes imbalance.  Dr Celesta Gentile, neurologist who will send records to Dr Nyoka Cowden. Pt has taken tylenol for pain in her neck which has helped. Reports chills. Hurts to swallow. Has cough, no congestion. Does not effect sleep.  Review of Systems:  Review of Systems  Constitutional: Positive for appetite change (after GI bug). Negative for fever.  HENT: Positive for congestion, hearing loss and sinus pressure. Negative for ear pain, facial swelling, mouth sores, nosebleeds, postnasal drip, rhinorrhea and sneezing.   Respiratory: Negative for cough, shortness of breath and wheezing.   Cardiovascular: Negative for chest pain, palpitations and leg swelling.  Gastrointestinal: Negative for nausea, diarrhea and constipation.    Past Medical History  Diagnosis Date  . BREAST CANCER, HX  OF 03/16/2007  . PEPTIC ULCER DISEASE 03/16/2007  . IRRITABLE BOWEL SYNDROME, HX OF 03/16/2007  . DEGENERATIVE JOINT DISEASE, CERVICAL SPINE 03/16/2007  . HYPERLIPIDEMIA 05/18/2007  . ANXIETY DEPRESSION 08/25/2009  . Essential hypertension, benign 10/28/2008  . CORONARY ATHEROSCLEROSIS NATIVE CORONARY ARTERY 10/28/2008  . CAROTID ARTERY STENOSIS 10/28/2008  . Chronic rhinitis 09/17/2008  . GERD 10/10/2007  . Diverticulitis of colon (without mention of hemorrhage) 03/16/2007  . CAD (coronary artery disease) 2005    s/p DES to LCX  . Cystitis   . Palpitations   . Colon polyps   . Hiatal hernia   . Esophageal stricture   . Asthma   . Blood transfusion without reported diagnosis     with child birth  . 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  October 10, '14 MRI brain: MPRESSION:  1. No acute intracranial abnormality.  2. Mild for age nonspecific signal changes in the brain, most  commonly due to chronic small vessel disease.    . Diverticulitis 08/05/2011    Recurrent sigmoid diverticulitis, resolved. Patient's last colonoscopy was 15-20 years ago. In light of at least two episodes of diverticulitis this year patient should have colonoscopy to exclude underlying neoplasm. Patient still feels weak from recent hospitalization and prefers to wait a month or so. She will contact us and schedule procedure toward the end of January 2013. Patient saw Dr. Ardis Hughs once in 2010 and had an EGD.  She saw Dr. Henrene Pastor in consult this past admission and would like to come under his care. I will forward this request to Drs. Henrene Pastor and Ardis Hughs.     . Other malaise and fatigue 09/06/2007    Qualifier: Diagnosis of  By: Linda Hedges MD, Heinz Knuckles   . Cerebral atrophy 08/28/2012  . Cerebrovascular disease 08/28/2012  . Lumbar vertebral fracture 06/13/2007   Past Surgical History  Procedure Laterality Date  . Reconstruction breast w/ tram flap  1999    right. Dr. Alphonsa Overall  . Hernia repair  2002    ventral  hernia  . Finger arthroplasty  2002    Right thumb  . Abdominal hysterectomy  1979  . Cholecystectomy    . Mastectomy  1999  . Oophorectomy    . Varicose vein surgery     Social History:   reports that she has never smoked. She has never used smokeless tobacco. She reports that she drinks alcohol. She reports that she does not use illicit drugs.  Family History  Problem Relation Age of Onset  . Cancer Father     Lung Cancer  . Asthma Other   . Cancer Other     breast  . COPD    . Heart disease Mother   . Heart disease Brother     Medications: Patient's Medications  New Prescriptions   No medications on file  Previous Medications   ALBUTEROL (PROVENTIL,VENTOLIN) 90 MCG/ACT INHALER    Inhale 2 puffs into the lungs every 4 (four) hours as needed. Shortness of  breath   ASCORBIC ACID (VITAMIN C) 1000 MG TABLET    Take 1,000 mg by mouth daily.   ASPIRIN 81 MG TABLET    Take 81 mg by mouth daily.    ATENOLOL (TENORMIN) 25 MG TABLET    TAKE 0.5 TABLET BY MOUTH EVERY DAY   BECLOMETHASONE (QVAR) 40 MCG/ACT INHALER    Inhale 2 puffs into the lungs 2 (two) times daily as needed. For shortness of breath   CETIRIZINE (ZYRTEC) 10 MG TABLET    Take 10 mg by mouth daily as needed. allergies   MULTIPLE VITAMIN (MULTIVITAMIN) CAPSULE    Take 1 capsule by mouth daily.     NEXIUM 40 MG CAPSULE    TAKE (1) CAPSULE DAILY.   NITROGLYCERIN (NITROSTAT) 0.4 MG SL TABLET    Place 1 tablet (0.4 mg total) under the tongue every 5 (five) minutes as needed. Chest pains.   RANITIDINE (ZANTAC) 150 MG TABLET    Take 150 mg by mouth at bedtime.   Modified Medications   No medications on file  Discontinued Medications   No medications on file     Physical Exam:  Filed Vitals:   09/11/14 1501  BP: 114/78  Pulse: 73  Temp: 98.2 F (36.8 C)  TempSrc: Oral  Resp: 10  Height: 5\' 2"  (1.575 m)  Weight: 141 lb (63.957 kg)  SpO2: 98%    Physical Exam  Constitutional: She is oriented to person,  place, and time. She appears well-developed and well-nourished. No distress.  HENT:  Head: Normocephalic and atraumatic.  Mouth/Throat: Oropharynx is clear and moist. No oropharyngeal exudate.  Eyes: Conjunctivae are normal. Pupils are equal, round, and reactive to light.  Neck: Normal range of motion. Neck supple.  Tenderness noted to neck on palpation, no lymph nodes noted   Cardiovascular: Normal rate, regular rhythm and normal heart sounds.   Pulmonary/Chest: Effort normal and breath sounds normal.  Abdominal: Soft.  Bowel sounds are normal.  Musculoskeletal: She exhibits no edema or tenderness.  Lymphadenopathy:    She has no cervical adenopathy.  Neurological: She is alert and oriented to person, place, and time.  Skin: Skin is warm and dry. She is not diaphoretic.  Psychiatric: She has a normal mood and affect.    Labs reviewed: Basic Metabolic Panel:  Recent Labs  12/17/13 1103  NA 141  K 5.6*  CL 101  CO2 26  GLUCOSE 111*  BUN 17  CREATININE 1.06*  CALCIUM 9.6  TSH 3.970   Liver Function Tests:  Recent Labs  12/17/13 1103  AST 15  ALT 16  ALKPHOS 74  BILITOT 0.3  PROT 7.1   No results for input(s): LIPASE, AMYLASE in the last 8760 hours. No results for input(s): AMMONIA in the last 8760 hours. CBC:  Recent Labs  12/17/13 1103  WBC 5.5  NEUTROABS 3.2  HGB 12.8  HCT 39.5  MCV 93  PLT 314   Lipid Panel: No results for input(s): CHOL, HDL, LDLCALC, TRIG, CHOLHDL, LDLDIRECT in the last 8760 hours. TSH:  Recent Labs  12/17/13 1103  TSH 3.970   A1C: Lab Results  Component Value Date   HGBA1C * 09/22/2010    6.1 (NOTE)                                                                       According to the ADA Clinical Practice Recommendations for 2011, when HbA1c is used as a screening test:   >=6.5%   Diagnostic of Diabetes Mellitus           (if abnormal result  is confirmed)  5.7-6.4%   Increased risk of developing Diabetes Mellitus   References:Diagnosis and Classification of Diabetes Mellitus,Diabetes XTAV,6979,48(AXKPV 1):S62-S69 and Standards of Medical Care in         Diabetes - 2011,Diabetes VZSM,2707,86  (Suppl 1):S11-S61.     Assessment/Plan Viral URI Supportive care, keep hydrated May use tylenol as needed for sore throat delsym every 12 hours as needed for cough To notify clinic nurse if symptoms get worse Follow up if symptoms persist longer than 1 week  Gastroenteritis Improved

## 2014-09-23 ENCOUNTER — Other Ambulatory Visit: Payer: Self-pay | Admitting: *Deleted

## 2014-09-23 MED ORDER — ESOMEPRAZOLE MAGNESIUM 40 MG PO CPDR: DELAYED_RELEASE_CAPSULE | ORAL | Status: DC

## 2014-09-23 NOTE — Telephone Encounter (Signed)
[facility]  

## 2015-02-16 ENCOUNTER — Other Ambulatory Visit: Payer: Self-pay

## 2017-05-11 ENCOUNTER — Ambulatory Visit (INDEPENDENT_AMBULATORY_CARE_PROVIDER_SITE_OTHER): Payer: Medicare Other | Admitting: Physician Assistant

## 2017-05-11 ENCOUNTER — Other Ambulatory Visit (HOSPITAL_COMMUNITY)
Admission: RE | Admit: 2017-05-11 | Discharge: 2017-05-11 | Disposition: A | Discharge status: Home / Self Care | Payer: Medicare Other | Source: Ambulatory Visit | Attending: Physician Assistant | Admitting: Physician Assistant

## 2017-05-11 ENCOUNTER — Encounter: Payer: Self-pay | Admitting: Physician Assistant

## 2017-05-11 VITALS — BP 140/76 | HR 72 | Temp 98.0°F | Ht 62.0 in | Wt 119.2 lb

## 2017-05-11 DIAGNOSIS — N760 Acute vaginitis: Secondary | ICD-10-CM | POA: Insufficient documentation

## 2017-05-11 DIAGNOSIS — Z87440 Personal history of urinary (tract) infections: Secondary | ICD-10-CM

## 2017-05-11 DIAGNOSIS — R21 Rash and other nonspecific skin eruption: Secondary | ICD-10-CM | POA: Insufficient documentation

## 2017-05-11 LAB — URINALYSIS, ROUTINE W REFLEX MICROSCOPIC
Bilirubin Urine: NEGATIVE
Ketones, ur: NEGATIVE
Leukocytes, UA: NEGATIVE
Nitrite: NEGATIVE
PH: 6 (ref 5.0–8.0)
Specific Gravity, Urine: 1.02 (ref 1.000–1.030)
Total Protein, Urine: NEGATIVE
Urine Glucose: NEGATIVE
Urobilinogen, UA: 0.2 (ref 0.0–1.0)

## 2017-05-11 MED ORDER — NYSTATIN 100000 UNIT/GM EX OINT: 1.0000 "application " | TOPICAL_OINTMENT | Freq: Two times a day (BID) | CUTANEOUS | 0 refills | Status: AC

## 2017-05-11 MED ORDER — LIDOCAINE HCL 2 % EX GEL
1.0000 | CUTANEOUS | 0 refills | Status: AC | PRN
Start: 2017-05-11 — End: ?

## 2017-05-11 NOTE — Patient Instructions (Signed)
It was great to see you!  Use the Nystatin Cream twice a day to help with the rash/irritation. The lidocaine jelly can be used as needed to help with pain.  We will call you with your urine and vaginal culture results.   Follow-up if symptoms worsen or persist.   Vaginitis Vaginitis is an inflammation of the vagina. It can happen when the normal bacteria and yeast in the vagina grow too much. There are different types. Treatment will depend on the type you have. Follow these instructions at home:  Take all medicines as told by your doctor.  Keep your vagina area clean and dry. Avoid soap. Rinse the area with water.  Avoid washing and cleaning out the vagina (douching).  Do not use tampons or have sex (intercourse) until your treatment is done.  Wipe from front to back after going to the restroom.  Wear cotton underwear.  Avoid wearing underwear while you sleep until your vaginitis is gone.  Avoid tight pants. Avoid underwear or nylons without a cotton panel.  Take off wet clothing (such as a bathing suit) as soon as you can.  Use mild, unscented products. Avoid fabric softeners and scented: ? Feminine sprays. ? Laundry detergents. ? Tampons. ? Soaps or bubble baths.  Practice safe sex and use condoms. Get help right away if:  You have belly (abdominal) pain.  You have a fever or lasting symptoms for more than 2-3 days.  You have a fever and your symptoms suddenly get worse. This information is not intended to replace advice given to you by your health care provider. Make sure you discuss any questions you have with your health care provider. Document Released: 11/04/2008 Document Revised: 01/14/2016 Document Reviewed: 01/19/2012 Elsevier Interactive Patient Education  2017 [employer].

## 2017-05-11 NOTE — Progress Notes (Signed)
[name] is a 83 y.o. female here for a new problem.  I acted as a Education administrator for [employer], PA-C Anselmo Pickler, LPN  History of Present Illness:   Chief Complaint  Patient presents with  . Perineal rash    Rash  This is a new problem. Episode onset: started a montha ago after taking antibiotics. The problem has been gradually worsening since onset. Location: perineal area. The rash is characterized by itchiness and burning (painful). Associated with:  was on antibiotics 4 times past 2 months. Pertinent negatives include no diarrhea, fever or vomiting. Past treatments include anti-itch cream ([place]). The treatment provided no relief.   Most recently on Macrobid, most recently completed approx 1 week ago.  [place] and is [place] for evaluation of her rash/irritation. She does state that she is due for a repeat UA on Monday at her doctor's office to make sure that her urine has cleared, however she is unsure if she is going to be able to get back to her home. She is currently asymptomatic with her dysuria. I do not have records of her past UA's or cultures.   Past Medical History:  Diagnosis Date  . ANXIETY DEPRESSION 08/25/2009  . Asthma   . 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  October 10, '14 MRI brain: MPRESSION:  1. No acute intracranial abnormality.  2. Mild for age nonspecific signal changes in the brain, most  commonly due to chronic small vessel disease.    . Blood transfusion without reported diagnosis    with child birth  . BREAST CANCER, HX OF 03/16/2007  . CAD (coronary artery disease) 2005   s/p DES to LCX  . CAROTID ARTERY STENOSIS 10/28/2008  . Cerebral atrophy 08/28/2012  . Cerebrovascular disease 08/28/2012  . Chronic rhinitis 09/17/2008  . Colon polyps   . CORONARY ATHEROSCLEROSIS NATIVE CORONARY ARTERY 10/28/2008  . Cystitis   . DEGENERATIVE JOINT DISEASE, CERVICAL SPINE 03/16/2007  . Diverticulitis  08/05/2011   Recurrent sigmoid diverticulitis, resolved. Patient's last colonoscopy was 15-20 years ago. In light of at least two episodes of diverticulitis this year patient should have colonoscopy to exclude underlying neoplasm. Patient still feels weak from recent hospitalization and prefers to wait a month or so. She will contact us and schedule procedure toward the end of January 2013. Patient saw Dr. Ardis Hughs once in 2010 and had an EGD. She saw Dr. Henrene Pastor in consult this past admission and would like to come under his care. I will forward this request to Drs. Henrene Pastor and Ardis Hughs.     . Diverticulitis of colon (without mention of hemorrhage)(562.11) 03/16/2007  . Esophageal stricture   . Essential hypertension, benign 10/28/2008  . GERD 10/10/2007  . Hiatal hernia   . HYPERLIPIDEMIA 05/18/2007  . IRRITABLE BOWEL SYNDROME, HX OF 03/16/2007  . Lumbar vertebral fracture ([place]) 06/13/2007  . Other malaise and fatigue 09/06/2007   Qualifier: Diagnosis of  By: Linda Hedges MD, Heinz Knuckles   . Palpitations   . PEPTIC ULCER DISEASE 03/16/2007     Social History   Social History  . Marital status: Widowed    Spouse name: N/A  . Number of children: 5  . Years of education: N/A   Occupational History  . Retired Marine scientist Retired   Social History Main Topics  . Smoking status: Never Smoker  . Smokeless tobacco: Never Used  . Alcohol use 0.0 oz/week     Comment: Special  Occasions   . Drug use: No  . Sexual activity: No   Other Topics Concern  . Not on file   Social History Narrative   Has several children 3 dtrs, 2 sons, and several grandchildren in [place] and [place]. She sees them often.   Lives independently. Is very active   Lives at [facility] since 06/25/1999. [facility] at the [facility] from about May to Nov each year.   Widowed   Living Will   MOST form signed   DNR   HCPOA daughter Karna Christmas and son Ronalee Belts           Past Surgical History:  Procedure Laterality Date  . ABDOMINAL HYSTERECTOMY   1979  . CHOLECYSTECTOMY    . FINGER ARTHROPLASTY  2002   Right thumb  . HERNIA REPAIR  2002   ventral hernia  . MASTECTOMY  1999  . OOPHORECTOMY    . RECONSTRUCTION BREAST W/ TRAM FLAP  1999   right. Dr. Alphonsa Overall  . VARICOSE VEIN SURGERY      Family History  Problem Relation Age of Onset  . Cancer Father        Lung Cancer  . Asthma Other   . Cancer Other        breast  . COPD Unknown   . Heart disease Mother   . Heart disease Brother     Allergies  Allergen Reactions  . Flagyl [Metronidazole Hcl] Diarrhea  . Amoxicillin-Pot Clavulanate Diarrhea  . Codeine Nausea And Vomiting    Over-sedating  . Diazepam     Hallucination   . Meperidine Hcl Nausea Only  . Sulfamethoxazole-Trimethoprim Diarrhea  . Valium     Elevated blood pressure    Current Medications:   Current Outpatient Prescriptions:  .  albuterol (PROVENTIL,VENTOLIN) 90 MCG/ACT inhaler, Inhale 2 puffs into the lungs every 4 (four) hours as needed. Shortness of  breath, Disp: , Rfl:  .  amiodarone (PACERONE) 200 MG tablet, amiodarone 200 mg tablet, Disp: , Rfl:  .  apixaban (ELIQUIS) 2.5 MG TABS tablet, Eliquis 2.5 mg tablet  bid, Disp: , Rfl:  .  beclomethasone (QVAR) 40 MCG/ACT inhaler, Inhale 2 puffs into the lungs 2 (two) times daily as needed. For shortness of breath, Disp: , Rfl:  .  fluticasone (FLONASE) 50 MCG/ACT nasal spray, fluticasone 50 mcg/actuation nasal spray,suspension  prn, Disp: , Rfl:  .  levothyroxine (SYNTHROID, LEVOTHROID) 50 MCG tablet, levothyroxine 50 mcg tablet  TK 1 T PO QD IN THE MORNING OES, Disp: , Rfl:  .  meclizine (ANTIVERT) 12.5 MG tablet, meclizine 12.5 mg tablet  1 bid, Disp: , Rfl:  .  midodrine (PROAMATINE) 5 MG tablet, midodrine 5 mg tablet, Disp: , Rfl:  .  nitroGLYCERIN (NITROSTAT) 0.4 MG SL tablet, Place 1 tablet (0.4 mg total) under the tongue every 5 (five) minutes as needed. Chest pains., Disp: 25 tablet, Rfl: 1 .  pantoprazole (PROTONIX) 40 MG tablet,  pantoprazole 40 mg tablet,delayed release, Disp: , Rfl:  .  Potassium Chloride ER 20 MEQ TBCR, potassium chloride ER 20 mEq tablet,extended release  1 tablet wed and friday only, Disp: , Rfl:  .  ascorbic acid (VITAMIN C) 1000 MG tablet, Take 1,000 mg by mouth daily., Disp: , Rfl:  .  cetirizine (ZYRTEC) 10 MG tablet, Take 10 mg by mouth daily as needed. allergies, Disp: , Rfl:  .  lidocaine (XYLOCAINE) 2 % jelly, Apply 1 application topically as needed., Disp: 30 mL, Rfl: 0 .  nystatin ointment (MYCOSTATIN), Apply 1 application topically 2 (two) times daily., Disp: 30 g, Rfl: 0   Review of Systems:   Review of Systems  Constitutional: Negative for chills, fever, malaise/fatigue and weight loss.  Gastrointestinal: Negative for abdominal pain, diarrhea, heartburn, nausea and vomiting.  Genitourinary: Negative for dysuria and urgency.  Skin: Positive for rash.  Psychiatric/Behavioral: Negative for depression. The patient is not nervous/anxious.     Vitals:   Vitals:   05/11/17 1313  BP: 140/76  Pulse: 72  Temp: 98 F (36.7 C)  TempSrc: Oral  SpO2: 97%  Weight: 119 lb 4 oz (54.1 kg)  Height: 5\' 2"  (1.575 m)     Body mass index is 21.81 kg/m.  Physical Exam:   Physical Exam  Constitutional: She appears well-developed. She is cooperative.  Non-toxic appearance. She does not have a sickly appearance. She does not appear ill. No distress.  Cardiovascular: Normal rate, regular rhythm, S1 normal, S2 normal, normal heart sounds and normal pulses.   No LE edema  Pulmonary/Chest: Effort normal and breath sounds normal.  Genitourinary: Rectum normal. There is tenderness in the vagina.  Genitourinary Comments: Mild erythema around vaginal introitus, speculum exam not performed 2/2 discomfort. Swab obtained to assess for BV and yeast. No discharge.   Neurological: She is alert. GCS eye subscore is 4. GCS verbal subscore is 5. GCS motor subscore is 6.  Skin: Skin is warm, dry and intact.   Psychiatric: She has a normal mood and affect. Her speech is normal and behavior is normal.  Nursing note and vitals reviewed.     Assessment and Plan:    Journee was seen today for perineal rash.  Diagnoses and all orders for this visit:  Acute vaginitis Suspect vaginitis from multiple rounds of antibiotics, possible yeast. Avoiding diflucan given current rx of amiodarone. Will assess swab for BV and yeast. I have given her a prescription of nystatin ointment and lidocaine jelly. Follow-up if symptoms worsen or persist. -     Cervicovaginal ancillary only -     Urinalysis, Routine w reflex microscopic -     Urine Culture  History of cystitis Repeat UA obtained, will add culture to assess for resolution of cystitis. Fax results to patient's medical home once obtained. Treatment per results. Follow-up symptoms worsen or persist. -     Urinalysis, Routine w reflex microscopic -     Urine Culture  Other orders -     lidocaine (XYLOCAINE) 2 % jelly; Apply 1 application topically as needed. -     nystatin ointment (MYCOSTATIN); Apply 1 application topically 2 (two) times daily.    . Reviewed expectations re: course of current medical issues. . Discussed self-management of symptoms. . Outlined signs and symptoms indicating need for more acute intervention. . Patient verbalized understanding and all questions were answered. . See orders for this visit as documented in the electronic medical record. . Patient received an After-Visit Summary.  CMA or LPN served as scribe during this visit. History, Physical, and Plan performed by medical provider. Documentation and orders reviewed and attested to.  Inda Coke, PA-C

## 2017-05-12 ENCOUNTER — Other Ambulatory Visit: Payer: Self-pay | Admitting: Physician Assistant

## 2017-05-12 LAB — URINE CULTURE
MICRO NUMBER: 81041971
SPECIMEN QUALITY: ADEQUATE

## 2017-05-12 LAB — CERVICOVAGINAL ANCILLARY ONLY
Bacterial vaginitis: NEGATIVE
CANDIDA VAGINITIS: NEGATIVE

## 2017-05-15 ENCOUNTER — Telehealth: Payer: Self-pay | Admitting: Physician Assistant

## 2017-05-15 NOTE — Telephone Encounter (Signed)
Patient returning missed call. Please call and advise.

## 2017-05-15 NOTE — Telephone Encounter (Signed)
See result note.  

## 2017-05-16 ENCOUNTER — Ambulatory Visit: Payer: Medicare Other | Admitting: Physician Assistant

## 2017-05-16 ENCOUNTER — Other Ambulatory Visit: Payer: Self-pay | Admitting: Physician Assistant

## 2017-05-16 MED ORDER — CLOTRIMAZOLE 1 % VA CREA: 1.0000 | TOPICAL_CREAM | Freq: Every day | VAGINAL | 0 refills | Status: AC

## 2017-05-16 NOTE — Progress Notes (Signed)
Patient called reporting lack of improvement despite treatment.  Discussed with Dr. Dimas Chyle. Patient has no new symptoms, no concerns for UTI. Will change Nystatin to Clotrimazole to assess if patient responds to this. Use cream x 1 week. Follow-up if lack of improvement or any change in symptoms.  Inda Coke PA-C 05/16/17

## 2021-05-22 DEATH — deceased
